# Patient Record
Sex: Male | Born: 1935 | Race: Black or African American | Hispanic: No | Smoking: Former smoker
Health system: Southern US, Community
[De-identification: ages and names within clinical notes are randomized; demographics above are authoritative.]

## PROBLEM LIST (undated history)

## (undated) DIAGNOSIS — M1990 Unspecified osteoarthritis, unspecified site: Secondary | ICD-10-CM

## (undated) DIAGNOSIS — Z8709 Personal history of other diseases of the respiratory system: Secondary | ICD-10-CM

## (undated) DIAGNOSIS — E785 Hyperlipidemia, unspecified: Secondary | ICD-10-CM

## (undated) DIAGNOSIS — I440 Atrioventricular block, first degree: Secondary | ICD-10-CM

## (undated) DIAGNOSIS — N529 Male erectile dysfunction, unspecified: Secondary | ICD-10-CM

## (undated) DIAGNOSIS — K219 Gastro-esophageal reflux disease without esophagitis: Secondary | ICD-10-CM

## (undated) DIAGNOSIS — R0600 Dyspnea, unspecified: Secondary | ICD-10-CM

## (undated) DIAGNOSIS — R0609 Other forms of dyspnea: Secondary | ICD-10-CM

## (undated) DIAGNOSIS — N471 Phimosis: Secondary | ICD-10-CM

## (undated) DIAGNOSIS — F419 Anxiety disorder, unspecified: Secondary | ICD-10-CM

## (undated) DIAGNOSIS — Z86010 Personal history of colonic polyps: Principal | ICD-10-CM

## (undated) DIAGNOSIS — K5730 Diverticulosis of large intestine without perforation or abscess without bleeding: Secondary | ICD-10-CM

## (undated) DIAGNOSIS — R351 Nocturia: Secondary | ICD-10-CM

## (undated) DIAGNOSIS — N281 Cyst of kidney, acquired: Secondary | ICD-10-CM

## (undated) DIAGNOSIS — F32A Depression, unspecified: Secondary | ICD-10-CM

## (undated) DIAGNOSIS — F329 Major depressive disorder, single episode, unspecified: Secondary | ICD-10-CM

## (undated) DIAGNOSIS — N289 Disorder of kidney and ureter, unspecified: Secondary | ICD-10-CM

## (undated) DIAGNOSIS — C61 Malignant neoplasm of prostate: Secondary | ICD-10-CM

## (undated) DIAGNOSIS — I10 Essential (primary) hypertension: Secondary | ICD-10-CM

## (undated) HISTORY — PX: COLONOSCOPY: SHX174

## (undated) HISTORY — DX: Personal history of colonic polyps: Z86.010

## (undated) HISTORY — DX: Gastro-esophageal reflux disease without esophagitis: K21.9

## (undated) HISTORY — DX: Anxiety disorder, unspecified: F41.9

## (undated) HISTORY — PX: CARDIOVASCULAR STRESS TEST: SHX262

---

## 1999-09-11 ENCOUNTER — Inpatient Hospital Stay (HOSPITAL_COMMUNITY): Admission: EM | Admit: 1999-09-11 | Discharge: 1999-09-20 | Payer: Self-pay | Admitting: *Deleted

## 1999-09-11 ENCOUNTER — Encounter: Payer: Self-pay | Admitting: Internal Medicine

## 1999-09-11 HISTORY — PX: APPENDECTOMY: SHX54

## 1999-09-12 ENCOUNTER — Encounter: Payer: Self-pay | Admitting: Surgery

## 1999-09-13 ENCOUNTER — Encounter: Payer: Self-pay | Admitting: Surgery

## 2000-12-17 ENCOUNTER — Encounter: Payer: Self-pay | Admitting: Surgery

## 2000-12-24 ENCOUNTER — Inpatient Hospital Stay (HOSPITAL_COMMUNITY): Admission: RE | Admit: 2000-12-24 | Discharge: 2000-12-31 | Payer: Self-pay | Admitting: Surgery

## 2000-12-24 HISTORY — PX: OTHER SURGICAL HISTORY: SHX169

## 2001-12-02 ENCOUNTER — Ambulatory Visit: Admission: RE | Admit: 2001-12-02 | Discharge: 2001-12-14 | Payer: Self-pay | Admitting: Radiation Oncology

## 2004-07-03 ENCOUNTER — Ambulatory Visit: Payer: Self-pay | Admitting: Endocrinology

## 2004-07-12 ENCOUNTER — Ambulatory Visit: Payer: Self-pay | Admitting: Endocrinology

## 2004-07-27 ENCOUNTER — Ambulatory Visit: Payer: Self-pay | Admitting: Endocrinology

## 2004-08-07 ENCOUNTER — Ambulatory Visit: Payer: Self-pay | Admitting: Endocrinology

## 2004-09-11 ENCOUNTER — Ambulatory Visit: Payer: Self-pay | Admitting: Endocrinology

## 2004-12-04 ENCOUNTER — Ambulatory Visit: Payer: Self-pay | Admitting: Endocrinology

## 2005-02-12 ENCOUNTER — Ambulatory Visit: Payer: Self-pay | Admitting: Endocrinology

## 2005-02-21 ENCOUNTER — Ambulatory Visit (HOSPITAL_COMMUNITY): Admission: RE | Admit: 2005-02-21 | Discharge: 2005-02-21 | Payer: Self-pay | Admitting: Endocrinology

## 2005-02-26 ENCOUNTER — Ambulatory Visit: Payer: Self-pay | Admitting: Endocrinology

## 2005-10-22 ENCOUNTER — Ambulatory Visit: Payer: Self-pay | Admitting: Endocrinology

## 2005-11-01 ENCOUNTER — Ambulatory Visit: Payer: Self-pay | Admitting: Endocrinology

## 2005-11-21 ENCOUNTER — Ambulatory Visit: Payer: Self-pay | Admitting: Endocrinology

## 2005-11-29 ENCOUNTER — Ambulatory Visit: Payer: Self-pay | Admitting: Endocrinology

## 2006-05-07 ENCOUNTER — Ambulatory Visit: Payer: Self-pay | Admitting: Endocrinology

## 2006-05-07 LAB — CONVERTED CEMR LAB
ALT: 15 units/L (ref 0–40)
AST: 17 units/L (ref 0–37)
Basophils Relative: 0.2 % (ref 0.0–1.0)
Calcium: 10.2 mg/dL (ref 8.4–10.5)
Chloride: 103 meq/L (ref 96–112)
Eosinophils Relative: 0.3 % (ref 0.0–5.0)
GFR calc Af Amer: 70 mL/min
GFR calc non Af Amer: 58 mL/min
Lymphocytes Relative: 19.3 % (ref 12.0–46.0)
Neutrophils Relative %: 72.8 % (ref 43.0–77.0)
Platelets: 236 10*3/uL (ref 150–400)
Potassium: 3.7 meq/L (ref 3.5–5.1)
RBC: 5.07 M/uL (ref 4.22–5.81)
Total Bilirubin: 0.7 mg/dL (ref 0.3–1.2)
WBC: 7.3 10*3/uL (ref 4.5–10.5)

## 2006-05-08 ENCOUNTER — Ambulatory Visit: Payer: Self-pay | Admitting: Cardiology

## 2006-09-15 ENCOUNTER — Encounter: Payer: Self-pay | Admitting: Endocrinology

## 2006-09-15 DIAGNOSIS — M1990 Unspecified osteoarthritis, unspecified site: Secondary | ICD-10-CM | POA: Insufficient documentation

## 2006-09-15 DIAGNOSIS — I10 Essential (primary) hypertension: Secondary | ICD-10-CM | POA: Insufficient documentation

## 2006-09-15 DIAGNOSIS — F0390 Unspecified dementia without behavioral disturbance: Secondary | ICD-10-CM | POA: Insufficient documentation

## 2006-11-26 ENCOUNTER — Ambulatory Visit: Payer: Self-pay | Admitting: Endocrinology

## 2006-11-26 LAB — CONVERTED CEMR LAB
AST: 23 units/L (ref 0–37)
BUN: 17 mg/dL (ref 6–23)
Bilirubin, Direct: 0.1 mg/dL (ref 0.0–0.3)
Direct LDL: 147.8 mg/dL
HDL: 49.5 mg/dL (ref >39.0)
Total Bilirubin: 0.8 mg/dL (ref 0.3–1.2)
Total CHOL/HDL Ratio: 4.1
Total Protein: 8.1 g/dL (ref 6.0–8.3)

## 2006-12-07 ENCOUNTER — Encounter: Payer: Self-pay | Admitting: Endocrinology

## 2007-06-11 ENCOUNTER — Encounter: Payer: Self-pay | Admitting: Endocrinology

## 2007-07-17 ENCOUNTER — Ambulatory Visit: Payer: Self-pay | Admitting: Endocrinology

## 2007-07-17 DIAGNOSIS — R519 Headache, unspecified: Secondary | ICD-10-CM | POA: Insufficient documentation

## 2007-07-17 DIAGNOSIS — R51 Headache: Secondary | ICD-10-CM | POA: Insufficient documentation

## 2007-07-19 LAB — CONVERTED CEMR LAB

## 2007-07-20 LAB — CONVERTED CEMR LAB
CO2: 28 meq/L (ref 19–32)
Chloride: 107 meq/L (ref 96–112)
GFR calc non Af Amer: 58 mL/min
Potassium: 4.2 meq/L (ref 3.5–5.1)
Vitamin B-12: 765 pg/mL (ref 211–911)

## 2007-08-03 ENCOUNTER — Encounter: Admission: RE | Admit: 2007-08-03 | Discharge: 2007-08-03 | Payer: Self-pay | Admitting: Endocrinology

## 2007-08-18 ENCOUNTER — Ambulatory Visit: Payer: Self-pay | Admitting: Endocrinology

## 2007-08-18 DIAGNOSIS — F329 Major depressive disorder, single episode, unspecified: Secondary | ICD-10-CM | POA: Insufficient documentation

## 2007-11-25 ENCOUNTER — Ambulatory Visit: Payer: Self-pay | Admitting: Endocrinology

## 2007-12-17 ENCOUNTER — Encounter: Payer: Self-pay | Admitting: Endocrinology

## 2007-12-28 ENCOUNTER — Telehealth: Payer: Self-pay | Admitting: Endocrinology

## 2008-03-29 ENCOUNTER — Ambulatory Visit: Payer: Self-pay | Admitting: Endocrinology

## 2008-04-13 ENCOUNTER — Ambulatory Visit: Payer: Self-pay | Admitting: Endocrinology

## 2008-04-13 DIAGNOSIS — N529 Male erectile dysfunction, unspecified: Secondary | ICD-10-CM | POA: Insufficient documentation

## 2008-04-13 DIAGNOSIS — M25519 Pain in unspecified shoulder: Secondary | ICD-10-CM | POA: Insufficient documentation

## 2008-04-13 DIAGNOSIS — M79609 Pain in unspecified limb: Secondary | ICD-10-CM | POA: Insufficient documentation

## 2008-04-13 LAB — CONVERTED CEMR LAB
ALT: 38 units/L (ref 0–53)
AST: 29 units/L (ref 0–37)
Albumin: 3.6 g/dL (ref 3.5–5.2)
Alkaline Phosphatase: 52 units/L (ref 39–117)
BUN: 22 mg/dL (ref 6–23)
Basophils Absolute: 0 10*3/uL (ref 0.0–0.1)
Basophils Relative: 1 % (ref 0.0–3.0)
Bilirubin Urine: NEGATIVE
Bilirubin, Direct: 0.1 mg/dL (ref 0.0–0.3)
CO2: 28 meq/L (ref 19–32)
Calcium: 9.6 mg/dL (ref 8.4–10.5)
Chloride: 104 meq/L (ref 96–112)
Cholesterol: 176 mg/dL (ref 0–200)
Creatinine, Ser: 1.3 mg/dL (ref 0.4–1.5)
Eosinophils Absolute: 0.2 10*3/uL (ref 0.0–0.7)
Eosinophils Relative: 3.6 % (ref 0.0–5.0)
GFR calc Af Amer: 70 mL/min
GFR calc non Af Amer: 58 mL/min
Glucose, Bld: 109 mg/dL — ABNORMAL HIGH (ref 70–99)
HCT: 44.5 % (ref 39.0–52.0)
HDL: 48.6 mg/dL (ref >39.0)
Hemoglobin, Urine: NEGATIVE
Hemoglobin: 14.7 g/dL (ref 13.0–17.0)
Ketones, ur: NEGATIVE mg/dL
LDL Cholesterol: 114 mg/dL — ABNORMAL HIGH (ref 0–99)
Leukocytes, UA: NEGATIVE
Lymphocytes Relative: 30.2 % (ref 12.0–46.0)
MCHC: 33 g/dL (ref 30.0–36.0)
MCV: 89.8 fL (ref 78.0–100.0)
Monocytes Absolute: 0.6 10*3/uL (ref 0.1–1.0)
Monocytes Relative: 13.6 % — ABNORMAL HIGH (ref 3.0–12.0)
Neutro Abs: 2.5 10*3/uL (ref 1.4–7.7)
Neutrophils Relative %: 51.6 % (ref 43.0–77.0)
Nitrite: NEGATIVE
PSA: 3.7 ng/mL (ref 0.10–4.00)
Platelets: 193 10*3/uL (ref 150–400)
Potassium: 4.5 meq/L (ref 3.5–5.1)
RBC: 4.96 M/uL (ref 4.22–5.81)
RDW: 13.1 % (ref 11.5–14.6)
Sodium: 138 meq/L (ref 135–145)
Specific Gravity, Urine: 1.015 (ref 1.000–1.03)
TSH: 1.65 microintl units/mL (ref 0.35–5.50)
Testosterone: 367.61 ng/dL (ref 350.00–890)
Total Bilirubin: 0.6 mg/dL (ref 0.3–1.2)
Total CHOL/HDL Ratio: 3.6
Total Protein, Urine: NEGATIVE mg/dL
Total Protein: 7.5 g/dL (ref 6.0–8.3)
Triglycerides: 69 mg/dL (ref 0–149)
Uric Acid, Serum: 6.4 mg/dL (ref 4.0–7.8)
Urine Glucose: NEGATIVE mg/dL
Urobilinogen, UA: 0.2 (ref 0.0–1.0)
VLDL: 14 mg/dL (ref 0–40)
WBC: 4.7 10*3/uL (ref 4.5–10.5)
pH: 5.5 (ref 5.0–8.0)

## 2008-04-22 ENCOUNTER — Telehealth (INDEPENDENT_AMBULATORY_CARE_PROVIDER_SITE_OTHER): Payer: Self-pay | Admitting: *Deleted

## 2008-05-11 ENCOUNTER — Ambulatory Visit: Payer: Self-pay | Admitting: Endocrinology

## 2008-05-11 DIAGNOSIS — E7800 Pure hypercholesterolemia, unspecified: Secondary | ICD-10-CM | POA: Insufficient documentation

## 2008-05-11 DIAGNOSIS — N400 Enlarged prostate without lower urinary tract symptoms: Secondary | ICD-10-CM | POA: Insufficient documentation

## 2008-06-16 ENCOUNTER — Encounter: Payer: Self-pay | Admitting: Endocrinology

## 2008-12-16 ENCOUNTER — Encounter: Payer: Self-pay | Admitting: Endocrinology

## 2008-12-19 ENCOUNTER — Ambulatory Visit: Payer: Self-pay | Admitting: Endocrinology

## 2009-05-08 ENCOUNTER — Ambulatory Visit: Payer: Self-pay | Admitting: Endocrinology

## 2009-05-08 DIAGNOSIS — IMO0001 Reserved for inherently not codable concepts without codable children: Secondary | ICD-10-CM | POA: Insufficient documentation

## 2009-05-08 LAB — CONVERTED CEMR LAB
BUN: 16 mg/dL (ref 6–23)
Basophils Relative: 0.6 % (ref 0.0–3.0)
Bilirubin Urine: NEGATIVE
Bilirubin, Direct: 0.1 mg/dL (ref 0.0–0.3)
CO2: 29 meq/L (ref 19–32)
Chloride: 106 meq/L (ref 96–112)
Cholesterol: 154 mg/dL (ref 0–200)
Creatinine, Ser: 1.3 mg/dL (ref 0.4–1.5)
Eosinophils Absolute: 0.1 10*3/uL (ref 0.0–0.7)
Glucose, Bld: 90 mg/dL (ref 70–99)
Hemoglobin, Urine: NEGATIVE
Leukocytes, UA: NEGATIVE
MCHC: 32.1 g/dL (ref 30.0–36.0)
MCV: 90.7 fL (ref 78.0–100.0)
Monocytes Absolute: 0.5 10*3/uL (ref 0.1–1.0)
Neutrophils Relative %: 53.9 % (ref 43.0–77.0)
Nitrite: NEGATIVE
PSA: 2.81 ng/mL (ref 0.10–4.00)
Platelets: 186 10*3/uL (ref 150.0–400.0)
RBC: 4.74 M/uL (ref 4.22–5.81)
Total Protein: 7.8 g/dL (ref 6.0–8.3)
Triglycerides: 79 mg/dL (ref 0.0–149.0)
Urobilinogen, UA: 0.2 (ref 0.0–1.0)
pH: 5 (ref 5.0–8.0)

## 2009-05-19 ENCOUNTER — Ambulatory Visit: Payer: Self-pay | Admitting: Endocrinology

## 2009-10-12 ENCOUNTER — Encounter: Payer: Self-pay | Admitting: Endocrinology

## 2009-12-26 ENCOUNTER — Ambulatory Visit: Payer: Self-pay | Admitting: Endocrinology

## 2009-12-27 LAB — CONVERTED CEMR LAB: Total CK: 186 units/L (ref 7–232)

## 2010-03-11 ENCOUNTER — Encounter: Payer: Self-pay | Admitting: Endocrinology

## 2010-03-22 NOTE — Letter (Signed)
[facility]: [facility]   Imported By: Sherian Rein 10/18/2009 13:17:47  _____________________________________________________________________  External Attachment:    Type:   Image     Comment:   External Document

## 2010-03-22 NOTE — Assessment & Plan Note (Signed)
Summary: pain in limbs-lb   Vital Signs:  Patient profile:   77 year old male Height:      71 inches (180.34 cm) Weight:      222 pounds (100.91 kg) BMI:     31.07 O2 Sat:      97 % on Room air Temp:     97.1 degrees F (36.17 degrees C) oral Pulse rate:   83 / minute BP sitting:   102 / 72  (left arm) Cuff size:   large  Vitals Entered By: Miguel Blake RMA (May 08, 2009 2:10 PM)  O2 Flow:  Room air CC: Pain in limbs (shoulders and knees) Xmonths/ pt states he is no longer taking Viagra/ CF   Primary Provider:  Everardo All  CC:  Pain in limbs (shoulders and knees) Xmonths/ pt states he is no longer taking Viagra/ CF.  History of Present Illness: pt states few mos of intermittent diffuse moderate myalgias, worst at the thighs and shoulders.  no associated numbness.   he says his bp med is expensive, and causes dizziness.   Current Medications (verified): 1)  Flomax 0.4 Mg  Cp24 (Tamsulosin Hcl) .... Take 1 By Mouth Once Daily 2)  Proscar 5 Mg  Tabs (Finasteride) .... Take 1 By Mouth Qd 3)  Zocor 80 Mg  Tabs (Simvastatin) .... Take 1 By Mouth Once Daily 4)  Diovan Hct 160-25 Mg  Tabs (Valsartan-Hydrochlorothiazide) .... Take 1 By Mouth Qd 5)  Viagra 50 Mg  Tabs (Sildenafil Citrate) .... Use Prn 6)  Celexa 20 Mg  Tabs (Citalopram Hydrobromide) .... Qd  Allergies (verified): 1)  ! Lescol 2)  ! Vasotec 3)  ! Penicillin  Past History:  Past Medical History: Last updated: 09/15/2006 Dementia Hypertension Osteoarthritis BPH Smoker (Quit 1980) Increased LDL Increased PSA/Prostate Cancer Non-Cardiogenic Decreased WBC  Review of Systems       The patient complains of weight gain.  The patient denies dyspnea on exertion and syncope.         dehies skin rash.  Physical Exam  General:  normal appearance.   Msk:  no muscle tenderness. gait is normal and steady Additional Exam:  LDL Cholesterol           84 mg/dL     Impression & Recommendations:  Problem # 1:   MYALGIA (ICD-729.1) uncertain etiology  Problem # 2:  HYPERCHOLESTEROLEMIA (ICD-272.0) well-controlled, but ? tolerates zocor  Problem # 3:  HYPERTENSION (ICD-401.9) overcontrolled  Medications Added to Medication List This Visit: 1)  Losartan Potassium-hctz 50-12.5 Mg Tabs (Losartan potassium-hctz) .Marland Kitchen.. 1 once daily  Other Orders: TLB-Lipid Panel (80061-LIPID) TLB-BMP (Basic Metabolic Panel-BMET) (80048-METABOL) TLB-CBC Platelet - w/Differential (85025-CBCD) TLB-Hepatic/Liver Function Pnl (80076-HEPATIC) TLB-TSH (Thyroid Stimulating Hormone) (84443-TSH) TLB-PSA (Prostate Specific Antigen) (84153-PSA) TLB-Udip w/ Micro (81001-URINE) TLB-Sedimentation Rate (ESR) (85652-ESR) TLB-CK Total Only(Creatine Kinase/CPK) (82550-CK) Est. Patient Level IV (11914)  Patient Instructions: 1)  blood tests today. 2)  tests are being ordered for you today.  a few days after the test(s), please call [phone] to hear your test results. 3)  physical soon.  4)  try stopping the zocor (simvastatin), on a trial basis, to see if this helps. 5)  change diovan-hct to losartan-hct, 50/12.5, 1 per day Prescriptions: LOSARTAN POTASSIUM-HCTZ 50-12.5 MG TABS (LOSARTAN POTASSIUM-HCTZ) 1 once daily  #30 x 11   Entered and Authorized by:   Minus Breeding MD   Signed by:   Minus Breeding MD on 05/08/2009   Method used:  Electronically to        [facility] [phone]* (retail)       [address]       [place], [place]  [address]       Ph: [phone]       Fax: [phone]   RxID:   [id number]
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
## 2010-04-16 ENCOUNTER — Encounter: Payer: Self-pay | Admitting: Endocrinology

## 2010-04-23 ENCOUNTER — Ambulatory Visit (INDEPENDENT_AMBULATORY_CARE_PROVIDER_SITE_OTHER): Payer: Medicare Other | Admitting: Endocrinology

## 2010-04-23 ENCOUNTER — Other Ambulatory Visit: Payer: Self-pay | Admitting: Endocrinology

## 2010-04-23 ENCOUNTER — Encounter: Payer: Self-pay | Admitting: Endocrinology

## 2010-04-23 ENCOUNTER — Other Ambulatory Visit: Payer: Medicare Other

## 2010-04-23 ENCOUNTER — Ambulatory Visit (INDEPENDENT_AMBULATORY_CARE_PROVIDER_SITE_OTHER)
Admission: RE | Admit: 2010-04-23 | Discharge: 2010-04-23 | Disposition: A | Discharge status: Home / Self Care | Payer: Medicare Other | Source: Ambulatory Visit | Attending: Endocrinology | Admitting: Endocrinology

## 2010-04-23 DIAGNOSIS — M25569 Pain in unspecified knee: Secondary | ICD-10-CM

## 2010-04-23 DIAGNOSIS — I10 Essential (primary) hypertension: Secondary | ICD-10-CM

## 2010-04-23 DIAGNOSIS — E7800 Pure hypercholesterolemia, unspecified: Secondary | ICD-10-CM

## 2010-04-23 DIAGNOSIS — D509 Iron deficiency anemia, unspecified: Secondary | ICD-10-CM

## 2010-04-23 DIAGNOSIS — E785 Hyperlipidemia, unspecified: Secondary | ICD-10-CM

## 2010-04-23 DIAGNOSIS — N401 Enlarged prostate with lower urinary tract symptoms: Secondary | ICD-10-CM

## 2010-04-23 DIAGNOSIS — D500 Iron deficiency anemia secondary to blood loss (chronic): Secondary | ICD-10-CM | POA: Insufficient documentation

## 2010-04-23 DIAGNOSIS — Z79899 Other long term (current) drug therapy: Secondary | ICD-10-CM

## 2010-04-23 DIAGNOSIS — F068 Other specified mental disorders due to known physiological condition: Secondary | ICD-10-CM

## 2010-04-23 DIAGNOSIS — N138 Other obstructive and reflux uropathy: Secondary | ICD-10-CM

## 2010-04-23 LAB — CBC WITH DIFFERENTIAL/PLATELET
Basophils Absolute: 0 10*3/uL (ref 0.0–0.1)
Eosinophils Relative: 3 % (ref 0.0–5.0)
Monocytes Absolute: 0.6 10*3/uL (ref 0.1–1.0)
Monocytes Relative: 12.9 % — ABNORMAL HIGH (ref 3.0–12.0)
Neutrophils Relative %: 48.4 % (ref 43.0–77.0)
Platelets: 204 10*3/uL (ref 150.0–400.0)
RDW: 14.4 % (ref 11.5–14.6)
WBC: 4.8 10*3/uL (ref 4.5–10.5)

## 2010-04-23 LAB — LIPID PANEL
Cholesterol: 256 mg/dL — ABNORMAL HIGH (ref 0–200)
HDL: 47 mg/dL (ref >39.00)
Triglycerides: 75 mg/dL (ref 0.0–149.0)
VLDL: 15 mg/dL (ref 0.0–40.0)

## 2010-04-23 LAB — LDL CHOLESTEROL, DIRECT: Direct LDL: 194 mg/dL

## 2010-04-23 LAB — BASIC METABOLIC PANEL
BUN: 16 mg/dL (ref 6–23)
Creatinine, Ser: 1.2 mg/dL (ref 0.4–1.5)
GFR: 79.6 mL/min (ref >60.00)

## 2010-04-23 LAB — HEPATIC FUNCTION PANEL
ALT: 19 U/L (ref 0–53)
AST: 20 U/L (ref 0–37)
Bilirubin, Direct: 0.1 mg/dL (ref 0.0–0.3)
Total Bilirubin: 0.8 mg/dL (ref 0.3–1.2)

## 2010-04-23 LAB — IBC PANEL: Iron: 74 ug/dL (ref 42–165)

## 2010-04-23 LAB — SEDIMENTATION RATE: Sed Rate: 19 mm/hr (ref 0–22)

## 2010-04-23 LAB — PSA: PSA: 2.99 ng/mL (ref 0.10–4.00)

## 2010-04-26 ENCOUNTER — Telehealth: Payer: Self-pay | Admitting: Endocrinology

## 2010-04-26 NOTE — Letter (Signed)
Summary: [facility]   Imported By: Lennie Odor 04/20/2010 12:32:27  _____________________________________________________________________  External Attachment:    Type:   Image     Comment:   External Document

## 2010-05-01 NOTE — Assessment & Plan Note (Signed)
Summary: BP IS ELEV /NWS   Vital Signs:  Patient profile:   77 year old male Height:      71 inches (180.34 cm) Weight:      224.50 pounds (102.05 kg) BMI:     31.42 O2 Sat:      96 % on Room air Temp:     99.0 degrees F (37.22 degrees C) oral Pulse rate:   69 / minute Pulse rhythm:   regular BP sitting:   138 / 82  (left arm) Cuff size:   large  Vitals Entered By: Brenton Grills CMA (AAMA) (April 23, 2010 1:32 PM)  O2 Flow:  Room air CC: Elevated BP over weekend/aj Is Patient Diabetic? No   Primary Provider:  Gurpreet Mariani  CC:  Elevated BP over weekend/aj.  History of Present Illness: pt cheched his bp at the drugstore, where it was 187/110.  pt states he feels well in general, except for a slight headache.   pt states 1 year of moderate pain at the knees, left > right.   dyslipdemia therapy is limited by perceived drug intolerances   Current Medications (verified): 1)  Flomax 0.4 Mg  Cp24 (Tamsulosin Hcl) .... Take 1 By Mouth Once Daily 2)  Proscar 5 Mg  Tabs (Finasteride) .... Take 1 By Mouth Qd 3)  Celexa 20 Mg  Tabs (Citalopram Hydrobromide) .... Qd 4)  Losartan Potassium-Hctz 50-12.5 Mg Tabs (Losartan Potassium-Hctz) .Marland Kitchen.. 1 Once Daily  Allergies (verified): 1)  ! Lescol 2)  ! Vasotec 3)  ! Penicillin  Past History:  Past Medical History: Last updated: 09/15/2006 Dementia Hypertension Osteoarthritis BPH Smoker (Quit 1980) Increased LDL Increased PSA/Prostate Cancer Non-Cardiogenic Decreased WBC  Review of Systems  The patient denies fever, chest pain, syncope, and dyspnea on exertion.    Physical Exam  General:  obese.  no distress  Msk:  knees are normal to my exam gait is normal and steady.   Extremities:  1+ right pedal edema and 1+ left pedal edema.     Impression & Recommendations:  Problem # 1:  HYPERTENSION (ICD-401.9) needs increased rx  Problem # 2:  KNEE PAIN, BILATERAL (ICD-719.46) Assessment: New  Problem # 3:   HYPERCHOLESTEROLEMIA (ICD-272.0) needs increased rx  Medications Added to Medication List This Visit: 1)  Losartan Potassium-hctz 50-12.5 Mg Tabs (Losartan potassium-hctz) .Marland Kitchen.. 1 1/2 tabs, once daily  Other Orders: T-Knee Left 2 view (73560TC) TLB-Lipid Panel (80061-LIPID) TLB-BMP (Basic Metabolic Panel-BMET) (80048-METABOL) TLB-CBC Platelet - w/Differential (85025-CBCD) TLB-Hepatic/Liver Function Pnl (80076-HEPATIC) TLB-TSH (Thyroid Stimulating Hormone) (84443-TSH) TLB-PSA (Prostate Specific Antigen) (84153-PSA) TLB-Sedimentation Rate (ESR) (85652-ESR) TLB-Uric Acid, Blood (84550-URIC) TLB-B12, Serum-Total ONLY (82607-B12) TLB-IBC Pnl (Iron/FE;Transferrin) (83550-IBC) Est. Patient Level IV ([address])  Patient Instructions: 1)  blood tests and an x-ray are being ordered for you today.  please call [phone] to hear your test results. 2)  increase losartan-hctz, to 1 1/2 pills once daily.   3)  try a pain cream to the knees, to see if it helps the pain.  call if you want a prescription pain medication instead. 4)  Please schedule a "wellness" appointment in 6 weeks. 5)  (update: i left message on phone-tree:  i advised zetia and/or colestipol.  i advised pt to see ortho.  call if you want referral).   Orders Added: 1)  T-Knee Left 2 view [73560TC] 2)  TLB-Lipid Panel [80061-LIPID] 3)  TLB-BMP (Basic Metabolic Panel-BMET) [80048-METABOL] 4)  TLB-CBC Platelet - w/Differential [85025-CBCD] 5)  TLB-Hepatic/Liver Function Pnl [80076-HEPATIC] 6)  TLB-TSH (Thyroid Stimulating Hormone) [84443-TSH] 7)  TLB-PSA (Prostate Specific Antigen) [84153-PSA] 8)  TLB-Sedimentation Rate (ESR) [85652-ESR] 9)  TLB-Uric Acid, Blood [84550-URIC] 10)  TLB-B12, Serum-Total ONLY [82607-B12] 11)  TLB-IBC Pnl (Iron/FE;Transferrin) [83550-IBC] 12)  Est. Patient Level IV [19147]

## 2010-05-01 NOTE — Progress Notes (Signed)
Summary: REFIL REQUEST  Phone Note Call from Patient Call back at Home Phone [phone]   Caller: Patient Summary of Call: PT IS REQUESTING CHOLESTEROL MEDICINE .  HE USES [facility] IN [place] [facility] [address]. Initial call taken by: Hilarie Fredrickson,  April 26, 2010 9:56 AM  Follow-up for Phone Call        Pt called requesting Rx for cholesterol mgmt and pain as discussed at OV.  Follow-up by: Margaret Pyle, CMA,  April 26, 2010 10:13 AM  Additional Follow-up for Phone Call Additional follow up Details #1::        i sent rx Additional Follow-up by: Minus Breeding MD,  April 26, 2010 10:53 AM    Additional Follow-up for Phone Call Additional follow up Details #2::    Pt is also requesting pain medication as discussed at OV Follow-up by: Margaret Pyle, CMA,  April 26, 2010 10:57 AM  Additional Follow-up for Phone Call Additional follow up Details #3:: Details for Additional Follow-up Action Taken: i printed Additional Follow-up by: Minus Breeding MD,  April 26, 2010 11:28 AM  New/Updated Medications: ZETIA 10 MG TABS (EZETIMIBE) 1 tab once daily HYDROCODONE-ACETAMINOPHEN 5-325 MG TABS (HYDROCODONE-ACETAMINOPHEN) 1/2 or 1 pill every 4 hrs as needed for pain Prescriptions: HYDROCODONE-ACETAMINOPHEN 5-325 MG TABS (HYDROCODONE-ACETAMINOPHEN) 1/2 or 1 pill every 4 hrs as needed for pain  #50 x 1   Entered and Authorized by:   Minus Breeding MD   Signed by:   Minus Breeding MD on 04/26/2010   Method used:   Print then Give to Patient   RxID:   [id number] ZETIA 10 MG TABS (EZETIMIBE) 1 tab once daily  #30 x 11   Entered and Authorized by:   Minus Breeding MD   Signed by:   Minus Breeding MD on 04/26/2010   Method used:   Electronically to        [facility] [phone]* (retail)       [address]       [place], [place]  [address]       Ph: [phone]       Fax: [phone]   RxID:   [id number]  Pt informed, Rx in cabinet for pt pick  up Margaret Pyle, CMA  April 26, 2010 12:57 PM

## 2010-05-23 ENCOUNTER — Other Ambulatory Visit: Payer: Self-pay | Admitting: Endocrinology

## 2010-06-04 ENCOUNTER — Encounter: Payer: Self-pay | Admitting: Endocrinology

## 2010-06-04 ENCOUNTER — Ambulatory Visit (INDEPENDENT_AMBULATORY_CARE_PROVIDER_SITE_OTHER): Payer: Medicare Other | Admitting: Endocrinology

## 2010-06-04 VITALS — BP 138/82 | HR 65 | Temp 98.2°F | Ht 71.0 in | Wt 225.2 lb

## 2010-06-04 DIAGNOSIS — R062 Wheezing: Secondary | ICD-10-CM | POA: Insufficient documentation

## 2010-06-04 DIAGNOSIS — Z0000 Encounter for general adult medical examination without abnormal findings: Secondary | ICD-10-CM

## 2010-06-04 DIAGNOSIS — I10 Essential (primary) hypertension: Secondary | ICD-10-CM

## 2010-06-04 DIAGNOSIS — I440 Atrioventricular block, first degree: Secondary | ICD-10-CM | POA: Insufficient documentation

## 2010-06-04 NOTE — Patient Instructions (Addendum)
please consider these measures for your health:  minimize alcohol.  do not use tobacco products.  keep firearms safely stored.  always use seat belts.  have working smoke alarms in your home.  see an eye doctor and dentist regularly.  never drive under the influence of alcohol or drugs (including prescription drugs).   please let me know what your wishes would be, if artificial life support measures should become necessary.  it is critically important to prevent falling down (keep floor areas well-lit, dry, and free of loose objects). i have requested a colonoscopy for you.  you will be called with a day and time for an appointment. Refer to an allergy specialist.  you will be called with a day and time for an appointment. Please return in 1 year.
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
## 2010-06-06 ENCOUNTER — Institutional Professional Consult (permissible substitution): Payer: Medicare Other | Admitting: Internal Medicine

## 2010-06-19 ENCOUNTER — Other Ambulatory Visit: Payer: Self-pay | Admitting: Endocrinology

## 2010-07-06 NOTE — Op Note (Signed)
[place]  Patient:    Miguel Blake, Miguel Blake                     MRN: [id number] Proc. Date: 09/11/99 Attending:  Velora Heckler, M.D. CC:         Titus Dubin. Alwyn Ren, M.D. [facility]                           Operative Report  PREOPERATIVE DIAGNOSIS:  Acute abdomen.  POSTOPERATIVE DIAGNOSIS:  Perforated appendicitis with peritonitis.  OPERATION PERFORMED:  Exploratory laparotomy with appendectomy.  SURGEON:  Velora Heckler, M.D.  ASSISTANT:  Lorne Skeens. Hoxworth, M.D.  ANESTHESIA:  General per Dr. Michelle Piper.  ESTIMATED BLOOD LOSS:  Minimal.  PREPARATION:  Betadine.  COMPLICATIONS:  None.  INDICATIONS FOR PROCEDURE:  The patient is a 77 year old white male who presesnts to the emergency department with a four-day history of abdominal pain, nausea and vomiting.  Abdominal x-ray showed findings consistent with partial small bowel obstruction.  CT scan of the abdomen done without oral contrast demonstrated large inflammatory process in the right colon with associated fluid and a small amount of free air.  Differential diagnosis included perforated appendicitis, perforated diverticular disease, or perforated colonic carcinoma.  The patient was brought to the operating room for exploration.  DESCRIPTION OF PROCEDURE:  The procedure was done in OR #15 at the [facility].  The patient was brought to the operating room and placed in supine position on the operating room table.  Following administration of general anesthesia, the patient was prepped and draped in the usual strict aseptic fashion.  After ascertaining that an adequate level of anesthesia had been obtained, a midline abdominal incision was made with a #10 blade.  Dissection was carried down through subcutaneous tissues and hemostasis obtained with the electrocautery.  Fascia was incised in the midline including incised through the incarcerated umbilical hernia and peritoneum  is reduced back through the peritoneal cavity.  The fascia was fully opened. The peritoneal cavity was entered cautiously.  There is gross purulent fluid throughout the peritoneal cavity.  Representative cultures were sent to the laboratory.  Using Pool sucker, approximately 600 cc of purulent fluid was evacuated.  The right side of the abdomen was addressed.  On exploration there was an obvious inflamed necrotic appendix with gross perforation near the tip with an appendiculith measuring less than 1 cm present in the right colic gutter.  This was extracted.  The appendix was mobilized.  Terminal ileum was mobilized from peritoneal attachments.  The appendiceal mesentery was divided between [facility] clamps and ligated with 3-0 silk ties.  Dissection was carried up to the base of the appendix which was grossly necrotic.  Using Allis clamps, a portion of the cecal wall was elevated and stapled with a TIA-30 stapler.  The appendix was then excised. Hemostasis was obtained with 3-0 silk sutures.  The abdomen was copiously irrigated with warm saline which was evacuated.  All four quadrants of the abdomen as well as the pelvis were copiously irrigated with warm fluid which was evacuated.  Omentum was used to cover the small bowel.  The midline wound was closed with running #1 Novofil suture.  The subcutaneous tissues were irrigated.  Hemostasis was obtained with the electrocautery.  Umbilicus was reapproximated with stainless steel staples.  Subcutaneous tissues were packed with Betadine soaked 4 x 4 gauze sponges.  Sterile dressings were  applied. The patient was awakened from anesthesia and brought to the recovery room in stable condition.  The patient tolerated the procedure well. DD:  09/11/99 TD:  09/13/99 Job: [id number]

## 2010-07-06 NOTE — Consult Note (Signed)
[place]. [facility]  Patient:    Miguel Blake, Miguel Blake                        MRN: [id number] Proc. Date: 09/11/99 Attending:  Velora Heckler, M.D. Dictator:   [phone] CC:         Titus Dubin. Alwyn Ren, M.D. [facility]             Velora Heckler, M.D.                          Consultation Report  (TIME DICTATED: 2150)  REASON FOR CONSULTATION: The patient is a 77 year old black male who presents with a four day history of abdominal pain and nausea and vomiting.  The patient had developed abdominal pain following eating a watermelon on September 08, 1999 and this was followed by nausea and vomiting.  The pain was located centrally and to the right side of the abdomen.  The patient denies fevers but did note chills.  He notes a decrease in bowel movements, with no bowel movement or flatus today.  He presented to the office of Dr. Romero Belling.  He was sent to the emergency department of [facility], where abdominal x-rays demonstrated partial small bowel obstruction.  The patient had an episode of emesis in the emergency department.  Abdominal x-ray showed dilated small bowel with air fluid levels.  CT scan of the abdomen and pelvis was obtained and showed fluid along the right colon with loculated free air outside the colonic lumen.  An inflammatory process was present in the mid right colon, with what appears to be a high-grade obstruction.  Differential diagnosis includes perforated appendicitis versus perforated diverticular disease versus perforated colon carcinoma.  This was reviewed personally with Dr. Irish Lack in the radiology department reading room.  PAST MEDICAL HISTORY:  1. History of benign prostatic hypertrophy.  2. History of osteoarthritis.  3. History of hypertension.  4. History of hypercholesterolemia.  MEDICATIONS:  1. Daypro 600 mg 2 p.o. p.r.n.  2. Lipitor 20 mg q.d.  3. Vasotec 10 mg p.o. b.i.d.  4. Flomax 0.4 mg q.d.  ALLERGIES:  PENICILLIN.  SOCIAL HISTORY: The patient is married and has four children.  He is employed at [employer].  He has a remote tobacco history, having quit in 1980.  He does not drink alcohol.  FAMILY HISTORY: No family history of colon cancer.  REVIEW OF SYSTEMS: As noted above.  Other significant positive include loss of appetite.  PHYSICAL EXAMINATION:  GENERAL: This patient is a 77 year old ill-appearing black male on a bed on ward 5700.  VITAL SIGNS: Temperature 98.4 degrees, pulse 119, respirations 20-30, blood pressure 133/86,  HEENT: Head normocephalic, atraumatic.  Sclerae nonicteric.  Mucous membranes are quite dry.  NECK: Supple without masses.  CHEST: Clear to auscultation bilaterally.  The patient is tachypneic with increased labored breathing and frequent hiccups.  A nasogastric tube is in the right naris.  CARDIAC: Tachycardia with regular rhythm.  ABDOMEN: Markedly distended.  No bowel sounds present.  There is bilious output in the nasogastric tube.  There is diffuse tenderness, with diffuse rebound and diffuse guarding.  The patient is maximally tender in the right lower quadrant.  There is an incarcerated umbilical hernia present which is only minimally tender.  EXTREMITIES: Nontender without edema.  NEUROLOGIC: No focal neurologic deficits.  He appears to be alert and oriented to person, place,  and time.  His wife is at the bedside.  LABORATORY DATA: WBC 11.7 with 87% segs, hemoglobin 15.1.  Creatinine 1.6.  RADIOGRAPHIC STUDIES: As noted in the History of Present Illness.  IMPRESSION: Acute abdomen, differential diagnosis including perforated appendicitis with abscess versus perforated diverticular disease versus perforated colonic carcinoma.  RECOMMENDATIONS:  1. Administration of intravenous antibiotics.  2. To operating room this evening for exploratory laparotomy, possible bowel     resection, possible colostomy, and drainage of possible  abscess.  I discussed this at the bedside at length with the patient and his wife and other family members.  I explained general anesthesia.  I explained exploratory laparotomy.  I explained the need for possible ostomy if there is perforation and significant contamination and infection.  I explained that this would be a temporary ostomy and could be reversed at a later date approximately three months in the future provided all went well.  I also discussed transfer to the intensive care unit postoperatively.  I will contact Dr. Alwyn Ren or whoever is on-call for his group prior to surgery.  I have notified the operating room that I would like to proceed later this evening. The patient understands and is in agreement. DD:  09/11/99 TD:  09/12/99 Job: [id number]

## 2010-07-06 NOTE — H&P (Signed)
[facility]  Patient:    Miguel Blake, Miguel Blake Visit Number: [id number] MRN: [id number]          Service Type: SUR Location: 3W 0353 01 Attending Physician:  Bonnetta Barry Dictated by:   Velora Heckler, M.D. Admit Date:  12/24/2000   CC:         Justine Null, M.D. [facility]   History and Physical  REASON FOR ADMISSION:  Ventral incisional hernia.  BRIEF HISTORY:  The patient is a 77 year old black male who underwent exploratory laparotomy in July 2001 at [facility] for perforated appendicitis with peritonitis.  Postoperative course was complicated by wound infection.  The patient eventually developed a midline ventral incisional hernia.  The patient is admitted at this time for repair of ventral incisional hernia.  PAST MEDICAL HISTORY: 1. History of exploratory laparotomy and appendectomy, July 2001, at [facility]. 2. History of hypertension. 3. History of benign prostatic hypertrophy.  MEDICATIONS: 1. Hydrocodone. 2. Enalapril 10 mg b.i.d. 3. Flomax.  ALLERGIES:  PENICILLIN.  SOCIAL HISTORY:  The patient is accompanied by his wife.  He does not smoke. He does not drink alcohol.  FAMILY HISTORY:  Notable for prostate cancer in the patients father.  REVIEW OF SYSTEMS:  Fifteen system review documented by medical record notable for hypertension, high cholesterol, prostate problems, back pain secondary to arthritis.  PHYSICAL EXAMINATION:  GENERAL:  A 77 year old well-developed, well-nourished black male, no acute distress.  VITAL SIGNS:  Temperature 96.1, pulse 60, respirations 18, blood pressure 130/90, O2 saturation 98% on room air, weight 213 pounds.  HEENT:  Normocephalic, atraumatic.  Sclerae are clear.  Dentition is fair.  NECK:  Supple without masses.  CHEST:  Clear to auscultation.  CARDIAC:  Shows regular rate and rhythm without murmur.  Peripheral pulses are full.  ABDOMEN:  Soft without  distention.  There is a well-healed scar in the lower midline.  With Valsalva there is an obvious hernia defect just to the right of the umbilicus.  There are no palpable masses.  There is no significant tenderness.  GENITALIA:  Penis and testicles are normal without lesion.  EXTREMITIES:  Nontender without edema.  NEUROLOGIC:  The patient is alert and oriented to person, place, and time without gross neurologic deficit.  LABORATORY STUDIES:  Complete blood count showing white count of 4.9, hemoglobin 13.7, hematocrit 40.5%, platelet count 217,000.  Prothrombin time was 13.5 with an INR of 1.0.  Chemistry profile is completely within normal limits.  Urinalysis is benign.  RADIOGRAPHIC STUDIES:  Chest x-ray dated December 17, 2000, shows tortuous aorta.  No active cardiopulmonary disease.  EKG:  Normal sinus rhythm with first degree AV block, otherwise, no acute abnormality.  IMPRESSION:  Ventral incisional hernia.  PLAN: 1. Admission to [facility]. 2. To operating room for laparoscopic ventral incisional hernia repair with    Gore-Tex dual mesh. 3. Routine postoperative care. Dictated by:   Velora Heckler, M.D. Attending Physician:  Bonnetta Barry DD:  12/24/00 TD:  12/24/00 Job: [id number]

## 2010-07-06 NOTE — Discharge Summary (Signed)
[place]. [facility]  Patient:    Miguel Blake, Miguel Blake                     MRN: [id number] Adm. Date:  09/11/99 Disc. Date: 09/20/99 Attending:  Velora Heckler, M.D. CC:         Titus Dubin. Alwyn Ren, M.D. [facility]   Discharge Summary  REASON FOR ADMISSION:  Abdominal pain, nausea, and vomiting.  HISTORY OF PRESENT ILLNESS:  The patient is a 77 year old black male who presented with a four-day history of abdominal pain, nausea, and vomiting. The patient did have chills.  He had had no bowel movement for the 24 hours prior to admission.  He was seen in the emergency department where an abdominal x-ray showed dilated small bowel with air/fluid levels.  A CT scan of the abdomen showed loculated free air and fluid around the right colon with inflammatory process in the right colon with high-grade obstruction.  The patient was admitted to the general medical service and general surgery was consulted.  HOSPITAL COURSE:  The patient was seen in consultation by general surgery.  A diagnosis of acute abdomen secondary to possible perforated appendicitis versus perforated diverticular disease versus perforated colon cancer was made.  The patient was prepared and taken to the operating room on the evening of September 11, 1999, where he underwent exploratory laparotomy and appendectomy for perforated appendicitis with peritonitis.  The patient was admitted to the step-down unit postoperatively.  He was treated with intravenous Primaxin. The patient had an open wound with dressing changes.  He remained relatively stable both from a cardiac and a pulmonary standpoint.  The patient defervesced on IV Primaxin.  He made gradual progress.  The ileus slowly resolved.  The nasogastric tube was removed on the fifth postoperative day and he was started on a clear liquid diet.  He was advanced to a full liquid diet and finally to a regular diet by the seventh postoperative day.  He  completed a full seven-day course of intravenous Primaxin and was observed for 24 hours after discontinuation of his antibiotics.  He was prepared for discharge home on the ninth postoperative day.  DISPOSITION:  The patient was discharged home today, September 20, 1999, in good condition, tolerating a regular diet, and ambulating independently.  FOLLOW-UP:  He will be seen by [facility] nurses twice daily for wound care.  He will be seen back at my office at [facility] in 10 days.  DISCHARGE MEDICATIONS:  Vicodin as needed for pain and other medications as per usual.  FINAL DIAGNOSIS:  Acute appendicitis with perforation and peritonitis.  CONDITION ON DISCHARGE:  Improved. DD:  09/20/99 TD:  09/21/99 Job: [id number]

## 2010-07-06 NOTE — Op Note (Signed)
[facility]  Patient:    Miguel Blake, Miguel Blake Visit Number: [id number] MRN: [id number]          Service Type: SUR Location: 3W 0353 01 Attending Physician:  Bonnetta Barry Dictated by:   Velora Heckler, M.D. Proc. Date: 12/24/00 Admit Date:  12/24/2000   CC:         Justine Null, M.D. [facility]   Operative Report  PREOPERATIVE DIAGNOSIS:  Ventral incisional hernia.  POSTOPERATIVE DIAGNOSIS:  Ventral incisional hernia.  OPERATION:  Laparoscopic repair of ventral incisional hernia with Gore-Tex dual mesh.  SURGEON:  Velora Heckler, M.D.  ASSISTANT:  Thornton Park. Daphine Deutscher, M.D.  ANESTHESIA:  General.  ESTIMATED BLOOD LOSS:  Minimal.  PREPARATION:  Betadine.  COMPLICATIONS:  None.  INDICATIONS: The patient is a 77 year old black male who had undergone exploratory laparotomy through a midline abdominal incision in July 2001 for perforated appendicitis.  Subsequently, the patient developed ventral incisional hernia.  He now comes to surgery for repair.  DESCRIPTION OF PROCEDURE:  The procedure was done in operating room #1 at the [facility]  The patient was brought to the operating room and placed in the supine position on the operating room table.  Following the administration of general anesthesia, the patient was prepped and draped in the usual strict aseptic fashion.  After ascertaining that an adequate level of anesthesia had been obtained, a transverse incision was made in the left lateral abdominal wall.  Dissection was carried through the subcutaneous tissues.  Muscles were split in line with their fibers.  The peritoneum was incised, and the peritoneal cavity was entered cautiously.  A 0 Vicryl pursestring suture was placed in the peritoneum.  An Hasson cannula was introduced and secured with a pursestring suture.  The abdomen was insufflated with carbon dioxide.  The laparoscope was introduced and the abdomen  explored. There were numerous adhesions of omentum and small bowel to the midline abdominal wound.  The hernia defect is identified.  The 5 mm operating port was placed in the left subcostal region.  Using a harmonic scalpel, lesions are lysed and mobilized off the anterior abdominal wall.  Care was taken to avoid injury to the small bowel.  The entire anterior abdominal wall was cleared.  An operating port was then placed in the right mid abdominal wall. The remaining adhesions were lysed freeing the entire anterior abdominal wall for clear visualization.  Good hemostasis was noted.  Next, spinal needles were placed through the abdominal wall to mark the margins of the fascial defect.  There was a large approximately 4 cm fascial defect immediately to the right of the umbilicus.  There is obvious disruption of the midline closure along the extent of the wound with small tiny less than 1 cm hernia defects visible.  After marking the margins of the hernia defect with spinal needles, a 5 cm additional margin is marked on the abdominal wall with a marking pin.  The measurements of the patch are 22 x 28 cm in size.  A sheet of dual mesh was brought on the field and marked and cut accordingly. Orientation marks were placed at the four points at the compass.  The #1 Novofil sutures are then placed through the mesh at the superior, inferior and left and right lateral aspects of the dual mesh.  The mesh is marked accordingly with the marking pin.  The mesh was then rolled.  The camera port is withdrawn, and the mesh  is inserted into the peritoneal cavity and the port replaced.  The mesh was then unrolled in the peritoneal cavity maintaining its proper orientation.  Using the suture retriever through stab wounds in the abdominal wall, the stay sutures were brought through the abdominal wall again superiorly, inferiorly and bilaterally.  These were tied securely allowing the dual mesh to be opposed  to the anterior abdominal wall.  Using the tacking device, two concentric rows of tacks were placed around the margins of the dual mesh securing it firmly to the anterior abdominal wall.  A few scattered tacks were placed in the midportion of the mesh to provide close approximation to the anterior abdominal wall.  Good hemostasis was noted.  Ports were removed under direct vision and pneumoperitoneum released.  All port sites were anesthetized with local anesthetic.  All surgical wounds were closed with interrupted 4-0 Vicryl subcuticular sutures.  The port used for the camera was closed in two layers with a 0 Vicryl suture in the peritoneum and a 0 Vicryl pursestring suture in the external oblique fascia.  Skin edges were then reapproximated with interrupted 4-0 Vicryl subcuticular suture.  All wounds were anesthetized with local anesthetic.  Benzoin and Steri-Strips were applied to all wounds.  Sterile dressings were applied.  The patient was awakened from anesthesia and brought to the recovery room in stable condition. The patient tolerated the procedure well. Dictated by:   Velora Heckler, M.D. Attending Physician:  Bonnetta Barry DD:  12/24/00 TD:  12/25/00 Job: [id number]

## 2010-07-06 NOTE — Discharge Summary (Signed)
[facility]  Patient:    Miguel Blake, Miguel Blake Visit Number: [id number] MRN: [id number]          Service Type: SUR Location: 3W 0353 01 Attending Physician:  Bonnetta Barry Dictated by:   Velora Heckler, M.D. Admit Date:  12/24/2000 Disc. Date: 12/31/00   CC:         Justine Null, M.D. [facility]   Discharge Summary  REASON FOR ADMISSION:  Incisional hernia.  HISTORY OF PRESENT ILLNESS:  The patient is a 77 year old, African-American male who had undergone exploratory laparotomy through a midline abdominal incision in July 2001, for perforated appendicitis.  He subsequently developed ventral incisional hernia.  The patient is now admitted at this time for elective repair.  HOSPITAL COURSE:  The patient was admitted on the morning of December 24, 2000. He was taken directly to the operating room where he underwent laparoscopic ventral incisional hernia repair with Gore-Tex dual mesh.  Postoperative course was complicated by ileus.  This required placement of a nasogastric tube.  The patient had gradual resolution of his ileus and was started on a clear liquid diet.  He had return of bowel function and was advanced to a full liquid diet and finally to a regular diet.  The patient was treated with intravenous antibiotics postoperatively.  At the time of discharge, his white blood cell count was normal at 9.3 and hemoglobin normal at 12.6.  DISPOSITION:  Discharged to home on December 31, 2000.  CONDITION ON DISCHARGE:  Good condition, tolerating a regular diet and ambulating independently.  The patient has had return of normal GI function.  DISCHARGE MEDICATIONS: 1. Vicodin as needed for pain. 2. Home medications for hypertension as per usual.  FOLLOWUP:  The patient will be seen back in Dr. Sid Falcon office at [facility] in two weeks.  DISCHARGE DIAGNOSIS:  Ventral incisional hernia. Dictated by:   Velora Heckler, M.D. Attending  Physician:  Bonnetta Barry DD:  12/31/00 TD:  12/31/00 Job: [id number]

## 2010-07-10 ENCOUNTER — Encounter: Payer: Self-pay | Admitting: Internal Medicine

## 2010-07-17 ENCOUNTER — Other Ambulatory Visit: Payer: Medicare Other

## 2010-07-17 ENCOUNTER — Ambulatory Visit (INDEPENDENT_AMBULATORY_CARE_PROVIDER_SITE_OTHER): Payer: Medicare Other | Admitting: Internal Medicine

## 2010-07-17 ENCOUNTER — Encounter: Payer: Self-pay | Admitting: *Deleted

## 2010-07-17 ENCOUNTER — Ambulatory Visit (INDEPENDENT_AMBULATORY_CARE_PROVIDER_SITE_OTHER)
Admission: RE | Admit: 2010-07-17 | Discharge: 2010-07-17 | Disposition: A | Discharge status: Home / Self Care | Payer: Medicare Other | Source: Ambulatory Visit | Attending: Internal Medicine | Admitting: Internal Medicine

## 2010-07-17 VITALS — BP 120/80 | HR 67 | Ht 70.0 in | Wt 222.4 lb

## 2010-07-17 DIAGNOSIS — J301 Allergic rhinitis due to pollen: Secondary | ICD-10-CM | POA: Insufficient documentation

## 2010-07-17 DIAGNOSIS — R062 Wheezing: Secondary | ICD-10-CM

## 2010-07-17 NOTE — Patient Instructions (Signed)
I understand the arthritis pains bother you a lot. Suggest you ask Dr Everardo All if it would help to see a rheumatologist.  For the wheeze and nose symptoms-    CXR -- for dx wheeze    Allergy profile-- for dx rhinitis due to pollen

## 2010-07-17 NOTE — Assessment & Plan Note (Signed)
This sounds like mild asthma. We will get CXR and PFT
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
## 2010-07-17 NOTE — Assessment & Plan Note (Addendum)
It sounds as if he may have enough rhinorhea and nasal congestion to be a problem, at least seasonally. We will send allergy profile to start. He gives vague description of pain in right ear/ face that may include some trigeminal neuralgia and or eustachian dysfunction.

## 2010-07-18 LAB — ALLERGY FULL PROFILE
Allergen,Goose feathers, e70: <0.35 kU/L (ref <0.35)
Alternaria Alternata: <0.35 kU/L (ref <0.35)
Aspergillus fumigatus, IgG: <0.35 kU/L (ref <0.35)
Bermuda Grass: <0.35 kU/L (ref <0.35)
Candida Albicans: <0.35 kU/L (ref <0.35)
D. farinae: <0.35 kU/L (ref <0.35)
Fescue: <0.35 kU/L (ref <0.35)
IgE (Immunoglobulin E), Serum: 16.1 IU/mL (ref 0.0–180.0)
Lamb's Quarters: <0.35 kU/L (ref <0.35)
Oak: <0.35 kU/L (ref <0.35)
Plantain: <0.35 kU/L (ref <0.35)
Stemphylium Botryosum: <0.35 kU/L (ref <0.35)
Timothy Grass: <0.35 kU/L (ref <0.35)

## 2010-07-27 ENCOUNTER — Encounter: Payer: Self-pay | Admitting: Internal Medicine

## 2010-08-09 NOTE — Progress Notes (Signed)
Quick Note:  Pt aware of results. ______ 

## 2010-08-24 ENCOUNTER — Ambulatory Visit: Payer: Medicare Other | Admitting: Internal Medicine

## 2010-10-25 ENCOUNTER — Other Ambulatory Visit: Payer: Self-pay | Admitting: Endocrinology

## 2010-10-25 ENCOUNTER — Ambulatory Visit: Payer: Medicare Other | Admitting: Internal Medicine

## 2010-11-24 ENCOUNTER — Other Ambulatory Visit: Payer: Self-pay | Admitting: Endocrinology

## 2010-12-18 ENCOUNTER — Other Ambulatory Visit: Payer: Self-pay | Admitting: Endocrinology

## 2010-12-18 NOTE — Telephone Encounter (Signed)
Done

## 2011-01-18 ENCOUNTER — Other Ambulatory Visit: Payer: Self-pay | Admitting: Endocrinology

## 2011-02-07 ENCOUNTER — Other Ambulatory Visit: Payer: Self-pay | Admitting: Endocrinology

## 2011-03-19 ENCOUNTER — Ambulatory Visit (INDEPENDENT_AMBULATORY_CARE_PROVIDER_SITE_OTHER): Payer: Medicare Other | Admitting: Endocrinology

## 2011-03-19 ENCOUNTER — Encounter: Payer: Self-pay | Admitting: Endocrinology

## 2011-03-19 DIAGNOSIS — R062 Wheezing: Secondary | ICD-10-CM

## 2011-03-19 NOTE — Progress Notes (Signed)
  Subjective:    Patient ID: Miguel Blake, male    DOB: 05-01-1932, 78 y.o.   MRN: [id number]  HPI Pt states few weeks of slight wheezing in the chest, and assoc dry-quality cough.   Past Medical History  Diagnosis Date  . DEMENTIA 09/15/2006  . HYPERTENSION 09/15/2006  . OSTEOARTHRITIS 09/15/2006  . BENIGN PROSTATIC HYPERTROPHY, WITH OBSTRUCTION 05/11/2008  . 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 HYPERCHOLESTEROLEMIA 05/11/2008  . Increased prostate specific antigen (PSA) velocity   . Non-cardiogenic pulmonary edema     Past Surgical History  Procedure Date  . Exercise stress cardiolite 01/17/2003  . Doppler echocardiography 08/27/2001  . Electrocardiogram 11/21/2005  . Appendectomy unsure of date    History   Social History  . Marital Status: Legally Separated    Spouse Name: N/A    Number of Children: N/A  . Years of Education: 10th grade   Occupational History  .      Retired   Social History Main Topics  . Smoking status: Former Smoker    Types: Cigarettes  . Smokeless tobacco: Not on file  . Alcohol Use: No     Quit drinking-heavy drinker  . Drug Use: No  . Sexually Active: Not on file   Other Topics Concern  . Not on file   Social History Narrative   Retired Producer, television/film/video    Current Outpatient Prescriptions on File Prior to Visit  Medication Sig Dispense Refill  . citalopram (CELEXA) 20 MG tablet TAKE 1 TABLET BY MOUTH EVERY DAY  30 tablet  6  . ezetimibe (ZETIA) 10 MG tablet Take 10 mg by mouth daily.        . finasteride (PROSCAR) 5 MG tablet TAKE ONE TABLET BY MOUTH DAILY  30 tablet  5  . HYDROcodone-acetaminophen (NORCO) 5-325 MG per tablet 1/2-1 tablet every 4 hours as needed for pain       . losartan-hydrochlorothiazide (HYZAAR) 50-12.5 MG per tablet TAKE 1 & 1/2 TABLETS BY MOUTH EVERY DAY  45 tablet  4  . Tamsulosin HCl (FLOMAX) 0.4 MG CAPS Take 0.4 mg by mouth daily.          Allergies    Allergen Reactions  . Enalapril Maleate   . Fluvastatin Sodium     REACTION: Myalgia  . Penicillins     Family History  Problem Relation Age of Onset  . Cancer Father     Prostate Cancer    BP 122/80  Pulse 56  Temp(Src) 97 F (36.1 C) (Oral)  Ht 5\' 10"  (1.778 m)  Wt 237 lb 6 oz (107.673 kg)  BMI 34.06 kg/m2  SpO2 96%   Review of Systems Denies fever and sob.      Objective:   Physical Exam VITAL SIGNS:  See vs page GENERAL: no distress head: no deformity eyes: no periorbital swelling, no proptosis external nose and ears are normal mouth: no lesion seen LUNGS:  Clear to auscultation      Assessment & Plan:  Asthma, mild, new

## 2011-03-19 NOTE — Patient Instructions (Addendum)
here is a sample of "advair-250."  take 1 puff 2x a day.  rinse mouth after using. Call if this helps, so i can send a prescription to your pharmacy.

## 2011-03-25 ENCOUNTER — Telehealth: Payer: Self-pay | Admitting: Endocrinology

## 2011-03-25 NOTE — Telephone Encounter (Signed)
Pt requesting advair inhaler 250-50 called in  --[facility]--[facility] don't know which [facility]---

## 2011-03-26 MED ORDER — FLUTICASONE-SALMETEROL 100-50 MCG/DOSE IN AEPB: 1.0000 | INHALATION_SPRAY | Freq: Two times a day (BID) | RESPIRATORY_TRACT | Status: DC

## 2011-03-26 NOTE — Telephone Encounter (Signed)
A sample was given at last OV, okay to fill?

## 2011-03-26 NOTE — Telephone Encounter (Signed)
i sent

## 2011-04-22 ENCOUNTER — Other Ambulatory Visit: Payer: Self-pay | Admitting: Endocrinology

## 2011-05-17 ENCOUNTER — Other Ambulatory Visit: Payer: Self-pay | Admitting: Endocrinology

## 2011-05-23 ENCOUNTER — Other Ambulatory Visit: Payer: Self-pay | Admitting: Endocrinology

## 2011-08-06 ENCOUNTER — Other Ambulatory Visit: Payer: Self-pay | Admitting: Endocrinology

## 2011-08-19 ENCOUNTER — Ambulatory Visit: Payer: Medicare Other | Admitting: Endocrinology

## 2011-08-19 DIAGNOSIS — Z0289 Encounter for other administrative examinations: Secondary | ICD-10-CM

## 2011-08-23 ENCOUNTER — Other Ambulatory Visit (INDEPENDENT_AMBULATORY_CARE_PROVIDER_SITE_OTHER): Payer: Medicare Other

## 2011-08-23 ENCOUNTER — Other Ambulatory Visit: Payer: Self-pay | Admitting: *Deleted

## 2011-08-23 DIAGNOSIS — N401 Enlarged prostate with lower urinary tract symptoms: Secondary | ICD-10-CM

## 2011-08-23 DIAGNOSIS — E7800 Pure hypercholesterolemia, unspecified: Secondary | ICD-10-CM

## 2011-08-23 DIAGNOSIS — N138 Other obstructive and reflux uropathy: Secondary | ICD-10-CM

## 2011-08-23 DIAGNOSIS — Z0000 Encounter for general adult medical examination without abnormal findings: Secondary | ICD-10-CM

## 2011-08-23 LAB — LDL CHOLESTEROL, DIRECT: Direct LDL: 137.5 mg/dL

## 2011-08-23 LAB — HEPATIC FUNCTION PANEL
Albumin: 3.5 g/dL (ref 3.5–5.2)
Alkaline Phosphatase: 54 U/L (ref 39–117)
Bilirubin, Direct: 0 mg/dL (ref 0.0–0.3)

## 2011-08-23 LAB — CBC WITH DIFFERENTIAL/PLATELET
Basophils Absolute: 0 10*3/uL (ref 0.0–0.1)
HCT: 42.2 % (ref 39.0–52.0)
Hemoglobin: 13.5 g/dL (ref 13.0–17.0)
Lymphs Abs: 1.7 10*3/uL (ref 0.7–4.0)
MCV: 88.7 fl (ref 78.0–100.0)
Monocytes Absolute: 0.6 10*3/uL (ref 0.1–1.0)
Neutro Abs: 2.9 10*3/uL (ref 1.4–7.7)
Platelets: 184 10*3/uL (ref 150.0–400.0)
RDW: 14.9 % — ABNORMAL HIGH (ref 11.5–14.6)

## 2011-08-23 LAB — LIPID PANEL
HDL: 44.1 mg/dL (ref >39.00)
Triglycerides: 91 mg/dL (ref 0.0–149.0)
VLDL: 18.2 mg/dL (ref 0.0–40.0)

## 2011-08-23 LAB — URINALYSIS, ROUTINE W REFLEX MICROSCOPIC
Bilirubin Urine: NEGATIVE
Ketones, ur: NEGATIVE
Leukocytes, UA: NEGATIVE
Total Protein, Urine: NEGATIVE
pH: 6 (ref 5.0–8.0)

## 2011-08-23 LAB — BASIC METABOLIC PANEL
Calcium: 9.3 mg/dL (ref 8.4–10.5)
Creatinine, Ser: 1 mg/dL (ref 0.4–1.5)
GFR: 90.08 mL/min (ref >60.00)
Glucose, Bld: 112 mg/dL — ABNORMAL HIGH (ref 70–99)
Sodium: 136 mEq/L (ref 135–145)

## 2011-08-23 LAB — PSA: PSA: 3.17 ng/mL (ref 0.10–4.00)

## 2011-08-28 ENCOUNTER — Encounter: Payer: Self-pay | Admitting: Endocrinology

## 2011-08-28 ENCOUNTER — Ambulatory Visit (INDEPENDENT_AMBULATORY_CARE_PROVIDER_SITE_OTHER): Payer: Medicare Other | Admitting: Endocrinology

## 2011-08-28 VITALS — BP 148/82 | HR 67 | Temp 97.7°F | Ht 70.0 in | Wt 239.0 lb

## 2011-08-28 DIAGNOSIS — I10 Essential (primary) hypertension: Secondary | ICD-10-CM

## 2011-08-28 DIAGNOSIS — R0602 Shortness of breath: Secondary | ICD-10-CM
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
## 2011-08-28 NOTE — Patient Instructions (Addendum)
please consider these measures for your health:  minimize alcohol.  do not use tobacco products.  have a colonoscopy at least every 10 years from age 78.  keep firearms safely stored.  always use seat belts.  have working smoke alarms in your home.  see an eye doctor and dentist regularly.  never drive under the influence of alcohol or drugs (including prescription drugs).   Please call if you decide to do the colonoscopy test.  This reduces your chances of dying from cancer.   You should have a vaccine against shingles (a painful rash which results from the  chickenpox infection which most people had many years ago).  This vaccine reduces, but does not totally eliminate the risk of shingles.  Because this is a medicare part d benefit, you should get it at a pharmacy.   Please return in 1 year. (update: we discussed code status.  pt requests full code, but would not want to be started or maintained on artificial life-support measures if there was not a reasonable chance of recovery)

## 2011-10-03 ENCOUNTER — Other Ambulatory Visit: Payer: Self-pay | Admitting: *Deleted

## 2011-10-03 MED ORDER — EZETIMIBE 10 MG PO TABS: ORAL_TABLET | ORAL | Status: DC

## 2011-10-03 NOTE — Telephone Encounter (Signed)
R'cd fax from [facility] for refill of Zetia.

## 2011-10-23 ENCOUNTER — Other Ambulatory Visit: Payer: Self-pay | Admitting: *Deleted

## 2011-10-23 MED ORDER — LOSARTAN POTASSIUM-HCTZ 50-12.5 MG PO TABS: ORAL_TABLET | ORAL | Status: DC

## 2011-10-23 NOTE — Telephone Encounter (Signed)
R'cd fax from [facility] for refill of Losartan/HCTZ refill

## 2011-10-28 ENCOUNTER — Other Ambulatory Visit: Payer: Self-pay | Admitting: *Deleted

## 2011-10-28 MED ORDER — CITALOPRAM HYDROBROMIDE 20 MG PO TABS: ORAL_TABLET | ORAL | Status: DC

## 2011-10-28 NOTE — Telephone Encounter (Signed)
R'cd fax from [facility] for refill of Celexa.

## 2012-02-10 ENCOUNTER — Other Ambulatory Visit: Payer: Self-pay | Admitting: Endocrinology

## 2012-02-24 ENCOUNTER — Telehealth (INDEPENDENT_AMBULATORY_CARE_PROVIDER_SITE_OTHER): Payer: Self-pay

## 2012-02-24 NOTE — Telephone Encounter (Signed)
I called pt to discuss upcoming appt. Pt states he has not seen his PCP or had any other work up for is abd discomfort. Pt advised he needs to see his pcp and have his abd pain evaluated prior to seeing a surgeon. Pt states he understands and will make appt with Dr Everardo All. Pt advised we are glad to see him in future if he has a surgical issue.

## 2012-03-02 ENCOUNTER — Ambulatory Visit (INDEPENDENT_AMBULATORY_CARE_PROVIDER_SITE_OTHER): Payer: Self-pay | Admitting: Surgery

## 2012-03-20 ENCOUNTER — Other Ambulatory Visit: Payer: Self-pay | Admitting: Endocrinology

## 2012-03-25 ENCOUNTER — Ambulatory Visit (INDEPENDENT_AMBULATORY_CARE_PROVIDER_SITE_OTHER): Payer: Medicare Other | Admitting: Endocrinology

## 2012-03-25 ENCOUNTER — Encounter: Payer: Self-pay | Admitting: Endocrinology

## 2012-03-25 VITALS — BP 126/80 | HR 80 | Wt 239.0 lb

## 2012-03-25 DIAGNOSIS — R109 Unspecified abdominal pain: Secondary | ICD-10-CM

## 2012-03-25 LAB — URINALYSIS, ROUTINE W REFLEX MICROSCOPIC
Bilirubin Urine: NEGATIVE
Nitrite: NEGATIVE
Total Protein, Urine: NEGATIVE
Urine Glucose: NEGATIVE
pH: 6.5 (ref 5.0–8.0)

## 2012-03-25 LAB — CBC WITH DIFFERENTIAL/PLATELET
Basophils Relative: 0.6 % (ref 0.0–3.0)
Eosinophils Relative: 2.7 % (ref 0.0–5.0)
HCT: 39.9 % (ref 39.0–52.0)
Hemoglobin: 13 g/dL (ref 13.0–17.0)
Lymphs Abs: 1.6 10*3/uL (ref 0.7–4.0)
MCV: 85.8 fl (ref 78.0–100.0)
Monocytes Absolute: 0.5 10*3/uL (ref 0.1–1.0)
Monocytes Relative: 10.6 % (ref 3.0–12.0)
Neutro Abs: 2.4 10*3/uL (ref 1.4–7.7)
RBC: 4.66 Mil/uL (ref 4.22–5.81)
WBC: 4.7 10*3/uL (ref 4.5–10.5)

## 2012-03-25 LAB — AMYLASE: Amylase: 183 U/L — ABNORMAL HIGH (ref 27–131)

## 2012-03-25 LAB — BASIC METABOLIC PANEL
BUN: 12 mg/dL (ref 6–23)
Chloride: 106 mEq/L (ref 96–112)
GFR: 76.88 mL/min (ref >60.00)
Potassium: 3.7 mEq/L (ref 3.5–5.1)
Sodium: 137 mEq/L (ref 135–145)

## 2012-03-25 MED ORDER — LACTULOSE 10 GM/15ML PO SOLN: 20.0000 g | Freq: Three times a day (TID) | ORAL | Status: DC

## 2012-03-25 NOTE — Progress Notes (Signed)
  Subjective:    Patient ID: Miguel Blake, male    DOB: 06/18/1932, 79 y.o.   MRN: [id number]  HPI Pt states few weeks of slight pain at the LLQ, near the umbilicus.  No assoc n/v.  He says he had not had this before.  Past Medical History  Diagnosis Date  . DEMENTIA 09/15/2006  . HYPERTENSION 09/15/2006  . OSTEOARTHRITIS 09/15/2006  . BENIGN PROSTATIC HYPERTROPHY, WITH OBSTRUCTION 05/11/2008  . 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 HYPERCHOLESTEROLEMIA 05/11/2008  . Increased prostate specific antigen (PSA) velocity   . Non-cardiogenic pulmonary edema     Past Surgical History  Procedure Date  . Exercise stress cardiolite 01/17/2003  . Doppler echocardiography 08/27/2001  . Electrocardiogram 11/21/2005  . Appendectomy unsure of date    History   Social History  . Marital Status: Legally Separated    Spouse Name: N/A    Number of Children: N/A  . Years of Education: 10th grade   Occupational History  .      Retired   Social History Main Topics  . Smoking status: Former Smoker    Types: Cigarettes  . Smokeless tobacco: Not on file  . Alcohol Use: No     Comment: Quit drinking-heavy drinker  . Drug Use: No  . Sexually Active: Not on file   Other Topics Concern  . Not on file   Social History Narrative   Retired Producer, television/film/video    Current Outpatient Prescriptions on File Prior to Visit  Medication Sig Dispense Refill  . citalopram (CELEXA) 20 MG tablet TAKE 1 TABLET BY MOUTH EVERY DAY  30 tablet  5  . ezetimibe (ZETIA) 10 MG tablet TAKE 1 TABLET BY MOUTH EVERY DAY  30 tablet  5  . finasteride (PROSCAR) 5 MG tablet TAKE 1 TABLET BY MOUTH EVERY DAY  30 tablet  0  . Fluticasone-Salmeterol (ADVAIR DISKUS) 100-50 MCG/DOSE AEPB Inhale 1 puff into the lungs 2 (two) times daily.  1 each  11  . HYDROcodone-acetaminophen (NORCO) 5-325 MG per tablet 1/2-1 tablet every 4 hours as needed for pain       .  losartan-hydrochlorothiazide (HYZAAR) 50-12.5 MG per tablet TAKE 1 & 1/2 TABLETS BY MOUTH EVERY DAY  45 tablet  5  . meloxicam (MOBIC) 7.5 MG tablet Take 7.5 mg by mouth daily as needed.       . Tamsulosin HCl (FLOMAX) 0.4 MG CAPS Take 0.4 mg by mouth daily.          Allergies  Allergen Reactions  . Enalapril Maleate   . Fluvastatin Sodium     REACTION: Myalgia  . Penicillins     Family History  Problem Relation Age of Onset  . Cancer Father     Prostate Cancer    BP 126/80  Pulse 80  Wt 239 lb (108.41 kg)  SpO2 94%  Review of Systems Denies diarrhea and BRBPR.      Objective:   Physical Exam VITAL SIGNS:  See vs page GENERAL: no distress ABDOMEN: abdomen is soft, nontender.  no hepatosplenomegaly. not distended.  no hernia.  Old healed surgical scar at the midline.       Assessment & Plan:  abd pain, new, uncertain etiology

## 2012-03-25 NOTE — Patient Instructions (Addendum)
blood tests are being requested for you today.  We'll contact you with results.   Let's also check a CT scan.  you will receive a phone call, about a day and time for an appointment.   I hope you feel better soon.  If you don't feel better by next week, please call back.  Please call sooner if you get worse. i have sent a prescription to your pharmacy, to help the bowels move.

## 2012-03-26 ENCOUNTER — Telehealth: Payer: Self-pay | Admitting: Endocrinology

## 2012-03-26 NOTE — Telephone Encounter (Signed)
Patient was told to contact us regarding instruction on how to take his contrast for his CT on Monday.  Please call him.

## 2012-03-26 NOTE — Telephone Encounter (Signed)
PATIENT INSTRUCTED TO CALL [facility] ON [address] WHERE HE WAS GIVEN THE FLUID TO GET INSTRUCTIONS FOR THIS.Marland Kitchen

## 2012-03-26 NOTE — Telephone Encounter (Signed)
Pt needs to know how to prep for his scan. Says he is unsure how to take the fluid / srs

## 2012-03-30 ENCOUNTER — Ambulatory Visit (INDEPENDENT_AMBULATORY_CARE_PROVIDER_SITE_OTHER)
Admission: RE | Admit: 2012-03-30 | Discharge: 2012-03-30 | Disposition: A | Discharge status: Home / Self Care | Payer: Medicare Other | Source: Ambulatory Visit | Attending: Endocrinology | Admitting: Endocrinology

## 2012-03-30 DIAGNOSIS — R109 Unspecified abdominal pain: Secondary | ICD-10-CM

## 2012-03-30 MED ORDER — IOHEXOL 300 MG/ML  SOLN
100.0000 mL | Freq: Once | INTRAMUSCULAR | Status: AC | PRN
  Administered 2012-03-30: 100 mL via INTRAVENOUS

## 2012-04-09 ENCOUNTER — Other Ambulatory Visit: Payer: Self-pay

## 2012-04-09 MED ORDER — LOSARTAN POTASSIUM-HCTZ 50-12.5 MG PO TABS: ORAL_TABLET | ORAL | Status: DC

## 2012-04-24 ENCOUNTER — Other Ambulatory Visit: Payer: Self-pay | Admitting: Endocrinology

## 2012-04-25 ENCOUNTER — Other Ambulatory Visit: Payer: Self-pay | Admitting: Endocrinology

## 2012-04-29 ENCOUNTER — Other Ambulatory Visit: Payer: Self-pay | Admitting: *Deleted

## 2012-04-29 MED ORDER — CITALOPRAM HYDROBROMIDE 20 MG PO TABS: ORAL_TABLET | ORAL | Status: DC

## 2012-05-06 ENCOUNTER — Other Ambulatory Visit: Payer: Self-pay | Admitting: *Deleted

## 2012-05-06 MED ORDER — EZETIMIBE 10 MG PO TABS: 10.0000 mg | ORAL_TABLET | Freq: Every day | ORAL | Status: DC

## 2012-05-20 ENCOUNTER — Other Ambulatory Visit: Payer: Self-pay | Admitting: *Deleted

## 2012-05-20 ENCOUNTER — Other Ambulatory Visit: Payer: Self-pay | Admitting: Endocrinology

## 2012-05-20 MED ORDER — CITALOPRAM HYDROBROMIDE 20 MG PO TABS: ORAL_TABLET | ORAL | Status: DC

## 2012-05-27 ENCOUNTER — Other Ambulatory Visit: Payer: Self-pay | Admitting: *Deleted

## 2012-05-27 MED ORDER — LOSARTAN POTASSIUM-HCTZ 50-12.5 MG PO TABS: 1.0000 | ORAL_TABLET | Freq: Every day | ORAL | Status: DC

## 2012-05-29 ENCOUNTER — Other Ambulatory Visit: Payer: Self-pay | Admitting: *Deleted

## 2012-05-29 ENCOUNTER — Other Ambulatory Visit: Payer: Self-pay | Admitting: Endocrinology

## 2012-05-29 MED ORDER — FINASTERIDE 5 MG PO TABS: 5.0000 mg | ORAL_TABLET | Freq: Every day | ORAL | Status: DC

## 2012-06-18 ENCOUNTER — Other Ambulatory Visit: Payer: Self-pay | Admitting: Endocrinology

## 2012-09-27 ENCOUNTER — Other Ambulatory Visit: Payer: Self-pay | Admitting: Endocrinology

## 2012-09-28 ENCOUNTER — Other Ambulatory Visit: Payer: Self-pay | Admitting: *Deleted

## 2012-09-28 MED ORDER — FINASTERIDE 5 MG PO TABS: 5.0000 mg | ORAL_TABLET | Freq: Every day | ORAL | Status: DC

## 2012-10-16 ENCOUNTER — Other Ambulatory Visit: Payer: Self-pay | Admitting: Endocrinology

## 2012-10-22 ENCOUNTER — Other Ambulatory Visit: Payer: Self-pay | Admitting: Endocrinology

## 2012-10-22 NOTE — Telephone Encounter (Signed)
Refill x 1 cpx is due 

## 2012-11-14 ENCOUNTER — Other Ambulatory Visit: Payer: Self-pay | Admitting: Endocrinology

## 2012-11-17 ENCOUNTER — Other Ambulatory Visit: Payer: Self-pay

## 2012-11-17 ENCOUNTER — Other Ambulatory Visit (INDEPENDENT_AMBULATORY_CARE_PROVIDER_SITE_OTHER): Payer: Medicare Other

## 2012-11-17 DIAGNOSIS — E7800 Pure hypercholesterolemia, unspecified: Secondary | ICD-10-CM

## 2012-11-17 DIAGNOSIS — Z0000 Encounter for general adult medical examination without abnormal findings: Secondary | ICD-10-CM

## 2012-11-17 DIAGNOSIS — Z0389 Encounter for observation for other suspected diseases and conditions ruled out: Secondary | ICD-10-CM

## 2012-11-17 DIAGNOSIS — I10 Essential (primary) hypertension: Secondary | ICD-10-CM

## 2012-11-17 LAB — CBC WITH DIFFERENTIAL/PLATELET
Eosinophils Relative: 5.3 % — ABNORMAL HIGH (ref 0.0–5.0)
Lymphocytes Relative: 31.6 % (ref 12.0–46.0)
MCV: 85.9 fl (ref 78.0–100.0)
Monocytes Absolute: 1 10*3/uL (ref 0.1–1.0)
Neutrophils Relative %: 48.1 % (ref 43.0–77.0)
Platelets: 241 10*3/uL (ref 150.0–400.0)
WBC: 6.6 10*3/uL (ref 4.5–10.5)

## 2012-11-17 LAB — BASIC METABOLIC PANEL
BUN: 15 mg/dL (ref 6–23)
Chloride: 106 mEq/L (ref 96–112)
GFR: 79.87 mL/min (ref >60.00)
Glucose, Bld: 113 mg/dL — ABNORMAL HIGH (ref 70–99)
Potassium: 3.8 mEq/L (ref 3.5–5.1)

## 2012-11-17 LAB — URINALYSIS, ROUTINE W REFLEX MICROSCOPIC
Nitrite: NEGATIVE
RBC / HPF: NONE SEEN (ref 0–?)
Specific Gravity, Urine: 1.025 (ref 1.000–1.030)
Total Protein, Urine: NEGATIVE
pH: 6 (ref 5.0–8.0)

## 2012-11-17 LAB — HEPATIC FUNCTION PANEL
ALT: 19 U/L (ref 0–53)
Bilirubin, Direct: 0 mg/dL (ref 0.0–0.3)
Total Bilirubin: 0.5 mg/dL (ref 0.3–1.2)

## 2012-11-17 LAB — LDL CHOLESTEROL, DIRECT: Direct LDL: 140.7 mg/dL

## 2012-11-17 LAB — PSA: PSA: 3.66 ng/mL (ref 0.10–4.00)

## 2012-11-17 LAB — LIPID PANEL
HDL: 42.8 mg/dL (ref >39.00)
VLDL: 16.6 mg/dL (ref 0.0–40.0)

## 2012-11-20 ENCOUNTER — Other Ambulatory Visit: Payer: Self-pay | Admitting: *Deleted

## 2012-11-20 MED ORDER — EZETIMIBE 10 MG PO TABS: 10.0000 mg | ORAL_TABLET | Freq: Every day | ORAL | Status: DC

## 2012-11-24 ENCOUNTER — Ambulatory Visit
Admission: RE | Admit: 2012-11-24 | Discharge: 2012-11-24 | Disposition: A | Discharge status: Home / Self Care | Payer: Medicare Other | Source: Ambulatory Visit | Attending: Endocrinology | Admitting: Endocrinology

## 2012-11-24 ENCOUNTER — Encounter: Payer: Self-pay | Admitting: Endocrinology

## 2012-11-24 ENCOUNTER — Ambulatory Visit (INDEPENDENT_AMBULATORY_CARE_PROVIDER_SITE_OTHER): Payer: Medicare Other | Admitting: Endocrinology

## 2012-11-24 VITALS — BP 130/80 | HR 74 | Ht 70.0 in | Wt 243.0 lb

## 2012-11-24 DIAGNOSIS — J301 Allergic rhinitis due to pollen: Secondary | ICD-10-CM

## 2012-11-24 DIAGNOSIS — R062 Wheezing: Secondary | ICD-10-CM

## 2012-11-24 MED ORDER — SILDENAFIL CITRATE 20 MG PO TABS: ORAL_TABLET | ORAL | Status: DC

## 2012-11-24 MED ORDER — EZETIMIBE 10 MG PO TABS: 10.0000 mg | ORAL_TABLET | Freq: Every day | ORAL | Status: DC

## 2012-11-24 MED ORDER — CITALOPRAM HYDROBROMIDE 20 MG PO TABS: 20.0000 mg | ORAL_TABLET | Freq: Every day | ORAL | Status: DC

## 2012-11-24 MED ORDER — TAMSULOSIN HCL 0.4 MG PO CAPS: 0.4000 mg | ORAL_CAPSULE | Freq: Every day | ORAL | Status: DC

## 2012-11-24 MED ORDER — LOSARTAN POTASSIUM-HCTZ 50-12.5 MG PO TABS: 1.0000 | ORAL_TABLET | Freq: Every day | ORAL | Status: DC

## 2012-11-24 MED ORDER — FLUTICASONE PROPIONATE 50 MCG/ACT NA SUSP: 2.0000 | Freq: Every day | NASAL | Status: DC

## 2012-11-24 MED ORDER — FINASTERIDE 5 MG PO TABS: 5.0000 mg | ORAL_TABLET | Freq: Every day | ORAL | Status: DC

## 2012-11-24 NOTE — Patient Instructions (Addendum)
please consider these measures for your health:  minimize alcohol.  do not use tobacco products.  have a colonoscopy at least every 10 years from age 79.  keep firearms safely stored.  always use seat belts.  have working smoke alarms in your home.  see an eye doctor and dentist regularly.  never drive under the influence of alcohol or drugs (including prescription drugs).   please let me know what your wishes would be, if artificial life support measures should become necessary.  it is critically important to prevent falling down (keep floor areas well-lit, dry, and free of loose objects.  If you have a cane, walker, or wheelchair, you should use it, even for short trips around the house.  Also, try not to rush).   Please stop the finasteride, and resume the tamsulosin.  i have sent a prescription to your pharmacy. A chest-x-ray is requested for you today.  We'll contact you with results.   i have sent a prescription to your pharmacy, for a nasal spray. Please come back for a follow-up appointment in 6 months.  i have sent a prescription to your pharmacy, for "viagra." Call if you decide to get the colonoscopy.  This reduces your chances of getting cancer.

## 2012-11-24 NOTE — Progress Notes (Signed)
Subjective:    Patient ID: Miguel Blake, male    DOB: 10/23/1932, 79 y.o.   MRN: [id number]  HPI Pt is here for regular wellness examination, and is feeling pretty well in general, and says chronic med probs are stable, except as noted below Past Medical History  Diagnosis Date  . DEMENTIA 09/15/2006  . HYPERTENSION 09/15/2006  . OSTEOARTHRITIS 09/15/2006  . BENIGN PROSTATIC HYPERTROPHY, WITH OBSTRUCTION 05/11/2008  . 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 HYPERCHOLESTEROLEMIA 05/11/2008  . Increased prostate specific antigen (PSA) velocity   . Non-cardiogenic pulmonary edema     Past Surgical History  Procedure Laterality Date  . Exercise stress cardiolite  01/17/2003  . Doppler echocardiography  08/27/2001  . Electrocardiogram  11/21/2005  . Appendectomy  unsure of date    History   Social History  . Marital Status: Legally Separated    Spouse Name: N/A    Number of Children: N/A  . Years of Education: 10th grade   Occupational History  .      Retired   Social History Main Topics  . Smoking status: Former Smoker    Types: Cigarettes  . Smokeless tobacco: Not on file  . Alcohol Use: No     Comment: Quit drinking-heavy drinker  . Drug Use: No  . Sexual Activity: Not on file   Other Topics Concern  . Not on file   Social History Narrative   Retired Producer, television/film/video   No current outpatient prescriptions on file prior to visit.   No current facility-administered medications on file prior to visit.   Allergies  Allergen Reactions  . Enalapril Maleate   . Fluvastatin Sodium     REACTION: Myalgia  . Penicillins    Family History  Problem Relation Age of Onset  . Cancer Father     Prostate Cancer   BP 130/80  Pulse 74  Ht 5\' 10"  (1.778 m)  Wt 243 lb (110.224 kg)  BMI 34.87 kg/m2  SpO2 94%  Review of Systems  Constitutional: Negative for fever.       He has weight gain  HENT: Negative for hearing  loss.   Eyes: Negative for visual disturbance.  Respiratory: Negative for shortness of breath.   Cardiovascular: Negative for chest pain.  Gastrointestinal: Negative for anal bleeding.  Genitourinary: Negative for hematuria and difficulty urinating.  Musculoskeletal: Negative for back pain.  Skin: Negative for rash.  Neurological: Negative for syncope and numbness.  Hematological: Does not bruise/bleed easily.  Psychiatric/Behavioral: Negative for dysphoric mood.       Objective:   Physical Exam VS: see vs page GEN: no distress NECK: supple, thyroid is not enlarged CHEST WALL: no deformity LUNGS: clear to auscultation BREASTS:  No gynecomastia CV: reg rate and rhythm, no murmur ABD: abdomen is soft, nontender.  no hepatosplenomegaly.  not distended.  no hernia GENITALIA:  Normal male.   RECTAL: normal external and internal exam.  heme neg. PROSTATE:  Normal size.  No nodule MUSCULOSKELETAL: muscle bulk and strength are grossly normal.  no obvious joint swelling.  gait is normal and steady PULSES:  no carotid bruit NEURO:  cn 2-12 grossly intact.   readily moves all 4's.   SKIN:  Normal texture and temperature.  No rash or suspicious lesion is visible.   NODES:  None palpable at the neck PSYCH: alert, oriented x3.  Does not appear anxious nor depressed.       Assessment &  Plan:  Wellness visit today, with problems stable, except as noted. we discussed code status.  pt requests full code, but would not want to be started or maintained on artificial life-support measures if there was not a reasonable chance of recovery    SEPARATE EVALUATION FOLLOWS--EACH PROBLEM HERE IS NEW, NOT RESPONDING TO TREATMENT, OR POSES SIGNIFICANT RISK TO THE PATIENT'S HEALTH: HISTORY OF THE PRESENT ILLNESS: Pt states few mos of slight congestion in the nose, and assoc wheezing.   PAST MEDICAL HISTORY reviewed and up to date today REVIEW OF SYSTEMS: Denies earache.  Ed sxs persist (he stopped  flomax, but continued finasteride).   PHYSICAL EXAMINATION: VITAL SIGNS:  See vs page GENERAL: no distress head: no deformity eyes: no periorbital swelling, no proptosis external nose and ears are normal mouth: no lesion seen IMPRESSION: Allergic rhinitis, new BPH: well-controlled ED: he needs increased rx PLAN: See instruction page

## 2012-12-25 ENCOUNTER — Ambulatory Visit (INDEPENDENT_AMBULATORY_CARE_PROVIDER_SITE_OTHER): Payer: Medicare Other | Admitting: Endocrinology

## 2012-12-25 ENCOUNTER — Encounter: Payer: Self-pay | Admitting: Endocrinology

## 2012-12-25 VITALS — BP 160/100 | HR 64 | Temp 97.6°F | Ht 70.0 in | Wt 244.8 lb

## 2012-12-25 DIAGNOSIS — R42 Dizziness and giddiness: Secondary | ICD-10-CM

## 2012-12-25 LAB — BASIC METABOLIC PANEL
Calcium: 9.7 mg/dL (ref 8.4–10.5)
Chloride: 102 mEq/L (ref 96–112)
GFR: 81.49 mL/min (ref >60.00)
Glucose, Bld: 108 mg/dL — ABNORMAL HIGH (ref 70–99)
Potassium: 4.1 mEq/L (ref 3.5–5.1)
Sodium: 135 mEq/L (ref 135–145)

## 2012-12-25 LAB — CBC WITH DIFFERENTIAL/PLATELET
Eosinophils Relative: 2.2 % (ref 0.0–5.0)
HCT: 42.8 % (ref 39.0–52.0)
Hemoglobin: 14.1 g/dL (ref 13.0–17.0)
Lymphocytes Relative: 27.7 % (ref 12.0–46.0)
Lymphs Abs: 1.7 10*3/uL (ref 0.7–4.0)
MCV: 84.6 fl (ref 78.0–100.0)
Monocytes Relative: 11.2 % (ref 3.0–12.0)
Neutro Abs: 3.6 10*3/uL (ref 1.4–7.7)
RBC: 5.06 Mil/uL (ref 4.22–5.81)
RDW: 14.8 % — ABNORMAL HIGH (ref 11.5–14.6)
WBC: 6.2 10*3/uL (ref 4.5–10.5)

## 2012-12-25 MED ORDER — TRAMADOL HCL 50 MG PO TABS: 50.0000 mg | ORAL_TABLET | Freq: Four times a day (QID) | ORAL | Status: DC | PRN

## 2012-12-25 NOTE — Progress Notes (Signed)
Subjective:    Patient ID: Miguel Blake, male    DOB: 11/25/1932, 79 y.o.   MRN: [id number]  HPI Pt states 1 day of moderate vertigenous-quality dizziness sensation in the head, but no assoc LOC.  Past Medical History  Diagnosis Date  . DEMENTIA 09/15/2006  . HYPERTENSION 09/15/2006  . OSTEOARTHRITIS 09/15/2006  . BENIGN PROSTATIC HYPERTROPHY, WITH OBSTRUCTION 05/11/2008  . 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 HYPERCHOLESTEROLEMIA 05/11/2008  . Increased prostate specific antigen (PSA) velocity   . Non-cardiogenic pulmonary edema     Past Surgical History  Procedure Laterality Date  . Exercise stress cardiolite  01/17/2003  . Doppler echocardiography  08/27/2001  . Electrocardiogram  11/21/2005  . Appendectomy  unsure of date    History   Social History  . Marital Status: Legally Separated    Spouse Name: N/A    Number of Children: N/A  . Years of Education: 10th grade   Occupational History  .      Retired   Social History Main Topics  . Smoking status: Former Smoker    Types: Cigarettes  . Smokeless tobacco: Not on file  . Alcohol Use: No     Comment: Quit drinking-heavy drinker  . Drug Use: No  . Sexual Activity: Not on file   Other Topics Concern  . Not on file   Social History Narrative   Retired Producer, television/film/video    Current Outpatient Prescriptions on File Prior to Visit  Medication Sig Dispense Refill  . citalopram (CELEXA) 20 MG tablet Take 1 tablet (20 mg total) by mouth daily.  90 tablet  3  . ezetimibe (ZETIA) 10 MG tablet Take 1 tablet (10 mg total) by mouth daily.  90 tablet  3  . fluticasone (FLONASE) 50 MCG/ACT nasal spray Place 2 sprays into the nose daily.  16 g  6  . losartan-hydrochlorothiazide (HYZAAR) 50-12.5 MG per tablet Take 1 tablet by mouth daily.  90 tablet  3  . sildenafil (REVATIO) 20 MG tablet 3-5 pills as needed for your nature  20 tablet  11  . tamsulosin (FLOMAX) 0.4 MG CAPS  capsule Take 1 capsule (0.4 mg total) by mouth daily.  90 capsule  3   No current facility-administered medications on file prior to visit.   Allergies  Allergen Reactions  . Enalapril Maleate   . Fluvastatin Sodium     REACTION: Myalgia  . Penicillins    Family History  Problem Relation Age of Onset  . Cancer Father     Prostate Cancer   BP 160/100  Pulse 64  Temp(Src) 97.6 F (36.4 C) (Oral)  Ht 5\' 10"  (1.778 m)  Wt 244 lb 12.8 oz (111.041 kg)  BMI 35.13 kg/m2  SpO2 98%   Review of Systems He has slight bifrontal headache, but no blurry vision.    Objective:   Physical Exam VS: see vs page GEN: no distress HEAD: head: no deformity eyes: no periorbital swelling, no proptosis external nose and ears are normal mouth: no lesion seen NECK: supple, thyroid is not enlarged.   CHEST WALL: no deformity. LUNGS: clear to auscultation. BREASTS:  No gynecomastia. CV: reg rate and rhythm, no murmur. MUSCULOSKELETAL: muscle bulk and strength are grossly normal.  no obvious joint swelling.  gait is steady, with a cane.   EXTEMITIES: no deformity. no edema.  PULSES: dorsalis pedis intact bilat.  no carotid bruit.   NEURO:  cn 2-12 grossly  intact.   readily moves all 4's.  sensation is intact to touch on the feet SKIN:  Normal texture and temperature.  No rash or suspicious lesion is visible.   NODES:  None palpable at the neck.   PSYCH: alert, oriented x3.  Does not appear anxious nor depressed.   Lab Results  Component Value Date   WBC 6.2 12/25/2012   HGB 14.1 12/25/2012   HCT 42.8 12/25/2012   MCV 84.6 12/25/2012   PLT 224.0 12/25/2012   Lab Results  Component Value Date   CREATININE 1.1 12/25/2012   BUN 12 12/25/2012   NA 135 12/25/2012   K 4.1 12/25/2012   CL 102 12/25/2012   CO2 27 12/25/2012      Assessment & Plan:  Dizziness, new, uncertain etiology.  This has an extensive ddx.  i told pt cause is usually not a serious med problem.  However, more serious probs such  as CVA or tumor are possible.   HTN: ? Related to headache

## 2012-12-25 NOTE — Patient Instructions (Addendum)
blood tests are being requested for you today.  We'll contact you with results. I hope you feel better soon.  If you don't feel better by next week, please call back.  Please call sooner if you get worse. If this does not get better, i'll request for you the MRI. Here is a prescription for the headache Please continue the same blood pressure medication for now.

## 2013-01-19 ENCOUNTER — Other Ambulatory Visit: Payer: Self-pay | Admitting: Endocrinology

## 2013-02-18 HISTORY — PX: CATARACT EXTRACTION W/ INTRAOCULAR LENS IMPLANT: SHX1309

## 2013-03-04 ENCOUNTER — Ambulatory Visit (INDEPENDENT_AMBULATORY_CARE_PROVIDER_SITE_OTHER): Payer: Medicare Other | Admitting: Endocrinology

## 2013-03-04 ENCOUNTER — Encounter: Payer: Self-pay | Admitting: Endocrinology

## 2013-03-04 ENCOUNTER — Telehealth: Payer: Self-pay

## 2013-03-04 VITALS — BP 122/80 | HR 66 | Temp 97.8°F | Ht 70.0 in | Wt 243.0 lb

## 2013-03-04 DIAGNOSIS — E7800 Pure hypercholesterolemia, unspecified: Secondary | ICD-10-CM

## 2013-03-04 MED ORDER — LOSARTAN POTASSIUM-HCTZ 100-25 MG PO TABS: 1.0000 | ORAL_TABLET | Freq: Every day | ORAL | Status: DC

## 2013-03-04 MED ORDER — TAMSULOSIN HCL 0.4 MG PO CAPS: 0.4000 mg | ORAL_CAPSULE | Freq: Every day | ORAL | Status: DC

## 2013-03-04 NOTE — Telephone Encounter (Signed)
Flomax was sent to [facility] per pt request.

## 2013-03-04 NOTE — Patient Instructions (Addendum)
i have sent a prescription to your pharmacy, to [facility], to refill the flomax. Please also increase the losartan-hctz.  i have sent a prescription to [facility] for this. Please come back for a follow-up appointment in 1 month.

## 2013-03-04 NOTE — Progress Notes (Signed)
   Subjective:    Patient ID: Miguel Blake, male    DOB: 1932-09-12, 80 y.o.   MRN: [id number]  HPI Pt returns for f/u of HTN.  He takes hyzaar as rx'ed, and tolerates well. Pt ran out of flomax, as [facility] is out for now.  He has no prostate sxs since then. Past Medical History  Diagnosis Date  . DEMENTIA 09/15/2006  . HYPERTENSION 09/15/2006  . OSTEOARTHRITIS 09/15/2006  . BENIGN PROSTATIC HYPERTROPHY, WITH OBSTRUCTION 05/11/2008  . 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 HYPERCHOLESTEROLEMIA 05/11/2008  . Increased prostate specific antigen (PSA) velocity   . Non-cardiogenic pulmonary edema     Past Surgical History  Procedure Laterality Date  . Exercise stress cardiolite  01/17/2003  . Doppler echocardiography  08/27/2001  . Electrocardiogram  11/21/2005  . Appendectomy  unsure of date    History   Social History  . Marital Status: Legally Separated    Spouse Name: N/A    Number of Children: N/A  . Years of Education: 10th grade   Occupational History  .      Retired   Social History Main Topics  . Smoking status: Former Smoker    Types: Cigarettes  . Smokeless tobacco: Not on file  . Alcohol Use: No     Comment: Quit drinking-heavy drinker  . Drug Use: No  . Sexual Activity: Not on file   Other Topics Concern  . Not on file   Social History Narrative   Retired Financial controller    Current Outpatient Prescriptions on File Prior to Visit  Medication Sig Dispense Refill  . citalopram (CELEXA) 20 MG tablet Take 1 tablet (20 mg total) by mouth daily.  90 tablet  3  . ezetimibe (ZETIA) 10 MG tablet Take 1 tablet (10 mg total) by mouth daily.  90 tablet  3  . fluticasone (FLONASE) 50 MCG/ACT nasal spray Place 2 sprays into the nose daily.  16 g  6  . FLUVIRIN INJ injection       . sildenafil (REVATIO) 20 MG tablet 3-5 pills as needed for your nature  20 tablet  11  . traMADol (ULTRAM) 50 MG tablet Take 1 tablet  (50 mg total) by mouth every 6 (six) hours as needed (headache).  30 tablet  0   No current facility-administered medications on file prior to visit.    Allergies  Allergen Reactions  . Enalapril Maleate   . Fluvastatin Sodium     REACTION: Myalgia  . Penicillins     Family History  Problem Relation Age of Onset  . Cancer Father     Prostate Cancer    BP 122/80  Pulse 66  Temp(Src) 97.8 F (36.6 C) (Oral)  Ht 5\' 10"  (1.778 m)  Wt 243 lb (110.224 kg)  BMI 34.87 kg/m2  SpO2 96%  Review of Systems Denies chest pain and sob    Objective:   Physical Exam VITAL SIGNS:  See vs page GENERAL: no distress Ext: no edema      Assessment & Plan:  HTN: he needs increased rx BPH: sxs are well-controlled, even off flomax, but he should resume to make sure.

## 2013-05-31 ENCOUNTER — Ambulatory Visit: Payer: Medicare Other | Admitting: Endocrinology

## 2013-06-07 ENCOUNTER — Encounter: Payer: Self-pay | Admitting: Endocrinology

## 2013-06-07 ENCOUNTER — Ambulatory Visit (INDEPENDENT_AMBULATORY_CARE_PROVIDER_SITE_OTHER): Payer: Medicare Other | Admitting: Endocrinology

## 2013-06-07 VITALS — BP 116/74 | HR 78 | Temp 98.6°F | Ht 70.0 in | Wt 242.0 lb

## 2013-06-07 DIAGNOSIS — M542 Cervicalgia: Secondary | ICD-10-CM

## 2013-06-07 MED ORDER — AZITHROMYCIN 500 MG PO TABS: 500.0000 mg | ORAL_TABLET | Freq: Every day | ORAL | Status: DC

## 2013-06-07 MED ORDER — TRAMADOL HCL 50 MG PO TABS: 50.0000 mg | ORAL_TABLET | Freq: Four times a day (QID) | ORAL | Status: DC | PRN

## 2013-06-07 NOTE — Progress Notes (Signed)
Subjective:    Patient ID: Miguel Blake, male    DOB: 11-Nov-1932, 80 y.o.   MRN: [id number]  HPI Pt states 1 week of slight pain at the neck (anterior and posterior), but no assoc prod cough.  Pain is worse at night.   Past Medical History  Diagnosis Date  . DEMENTIA 09/15/2006  . HYPERTENSION 09/15/2006  . OSTEOARTHRITIS 09/15/2006  . BENIGN PROSTATIC HYPERTROPHY, WITH OBSTRUCTION 05/11/2008  . 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 HYPERCHOLESTEROLEMIA 05/11/2008  . Increased prostate specific antigen (PSA) velocity   . Non-cardiogenic pulmonary edema     Past Surgical History  Procedure Laterality Date  . Exercise stress cardiolite  01/17/2003  . Doppler echocardiography  08/27/2001  . Electrocardiogram  11/21/2005  . Appendectomy  unsure of date    History   Social History  . Marital Status: Legally Separated    Spouse Name: N/A    Number of Children: N/A  . Years of Education: 10th grade   Occupational History  .      Retired   Social History Main Topics  . Smoking status: Former Smoker    Types: Cigarettes  . Smokeless tobacco: Not on file  . Alcohol Use: No     Comment: Quit drinking-heavy drinker  . Drug Use: No  . Sexual Activity: Not on file   Other Topics Concern  . Not on file   Social History Narrative   Retired Financial controller    Current Outpatient Prescriptions on File Prior to Visit  Medication Sig Dispense Refill  . citalopram (CELEXA) 20 MG tablet Take 1 tablet (20 mg total) by mouth daily.  90 tablet  3  . ezetimibe (ZETIA) 10 MG tablet Take 1 tablet (10 mg total) by mouth daily.  90 tablet  3  . fluticasone (FLONASE) 50 MCG/ACT nasal spray Place 2 sprays into the nose daily.  16 g  6  . FLUVIRIN INJ injection       . losartan-hydrochlorothiazide (HYZAAR) 100-25 MG per tablet Take 1 tablet by mouth daily.  90 tablet  3  . sildenafil (REVATIO) 20 MG tablet 3-5 pills as needed for your  nature  20 tablet  11  . tamsulosin (FLOMAX) 0.4 MG CAPS capsule Take 1 capsule (0.4 mg total) by mouth daily.  90 capsule  3   No current facility-administered medications on file prior to visit.    Allergies  Allergen Reactions  . Enalapril Maleate   . Fluvastatin Sodium     REACTION: Myalgia  . Penicillins     Family History  Problem Relation Age of Onset  . Cancer Father     Prostate Cancer    BP 116/74  Pulse 78  Temp(Src) 98.6 F (37 C) (Oral)  Ht 5\' 10"  (1.778 m)  Wt 242 lb (109.77 kg)  BMI 34.72 kg/m2  SpO2 97%  Review of Systems Denies sob and chest pain.      Objective:   Physical Exam VITAL SIGNS:  See vs page GENERAL: no distress Neck: full rom, but full flexion is painful.  There is no palpable thyroid enlargement.  No thyroid nodule is palpable.  No palpable lymphadenopathy at the neck.  No tenderness.  LUNGS:  Clear to auscultation HEART:  Regular rate and rhythm without murmurs noted. Normal S1,S2.        Assessment & Plan:  HTN: well-controlled Acute bronchitis, new Neck pain: pt says this is due  to cough--i agree.

## 2013-06-07 NOTE — Patient Instructions (Addendum)
i have sent a prescriptions: antibiotic and pain/cough pill.   Please skip the citalopram while you are on the azithromycin, due to an interaction.   I hope you feel better soon.  If you don't feel better by next week, please call back.  Please call sooner if you get worse.

## 2013-10-01 ENCOUNTER — Ambulatory Visit (INDEPENDENT_AMBULATORY_CARE_PROVIDER_SITE_OTHER): Payer: Medicare Other | Admitting: Endocrinology

## 2013-10-01 ENCOUNTER — Encounter: Payer: Self-pay | Admitting: Endocrinology

## 2013-10-01 VITALS — BP 132/90 | HR 69 | Temp 98.1°F | Ht 70.0 in | Wt 239.0 lb

## 2013-10-01 DIAGNOSIS — R109 Unspecified abdominal pain: Secondary | ICD-10-CM

## 2013-10-01 DIAGNOSIS — D500 Iron deficiency anemia secondary to blood loss (chronic): Secondary | ICD-10-CM

## 2013-10-01 LAB — BASIC METABOLIC PANEL
BUN: 15 mg/dL (ref 6–23)
CHLORIDE: 103 meq/L (ref 96–112)
CO2: 26 mEq/L (ref 19–32)
Calcium: 9.9 mg/dL (ref 8.4–10.5)
Creatinine, Ser: 1.4 mg/dL (ref 0.4–1.5)
GFR: 62.35 mL/min (ref >60.00)
GLUCOSE: 113 mg/dL — AB (ref 70–99)
POTASSIUM: 4.3 meq/L (ref 3.5–5.1)
Sodium: 132 mEq/L — ABNORMAL LOW (ref 135–145)

## 2013-10-01 LAB — URINALYSIS, ROUTINE W REFLEX MICROSCOPIC
Bilirubin Urine: NEGATIVE
Ketones, ur: NEGATIVE
Leukocytes, UA: NEGATIVE
Nitrite: NEGATIVE
Specific Gravity, Urine: 1.025 (ref 1.000–1.030)
Total Protein, Urine: NEGATIVE
URINE GLUCOSE: NEGATIVE
UROBILINOGEN UA: 0.2 (ref 0.0–1.0)
pH: 6 (ref 5.0–8.0)

## 2013-10-01 LAB — CBC WITH DIFFERENTIAL/PLATELET
BASOS ABS: 0 10*3/uL (ref 0.0–0.1)
Basophils Relative: 0.6 % (ref 0.0–3.0)
EOS ABS: 0.2 10*3/uL (ref 0.0–0.7)
Eosinophils Relative: 4 % (ref 0.0–5.0)
HCT: 41.9 % (ref 39.0–52.0)
Hemoglobin: 13.6 g/dL (ref 13.0–17.0)
LYMPHS ABS: 1.6 10*3/uL (ref 0.7–4.0)
Lymphocytes Relative: 32.4 % (ref 12.0–46.0)
MCHC: 32.5 g/dL (ref 30.0–36.0)
MCV: 87.9 fl (ref 78.0–100.0)
MONO ABS: 0.7 10*3/uL (ref 0.1–1.0)
Monocytes Relative: 13.1 % — ABNORMAL HIGH (ref 3.0–12.0)
Neutro Abs: 2.5 10*3/uL (ref 1.4–7.7)
Neutrophils Relative %: 49.9 % (ref 43.0–77.0)
PLATELETS: 204 10*3/uL (ref 150.0–400.0)
RBC: 4.76 Mil/uL (ref 4.22–5.81)
RDW: 14.6 % (ref 11.5–15.5)
WBC: 5 10*3/uL (ref 4.0–10.5)

## 2013-10-01 LAB — IBC PANEL
IRON: 76 ug/dL (ref 42–165)
Saturation Ratios: 21 % (ref 20.0–50.0)
Transferrin: 258.5 mg/dL (ref 212.0–360.0)

## 2013-10-01 NOTE — Progress Notes (Signed)
Subjective:    Patient ID: Miguel Blake, male    DOB: 1932-09-03, 81 y.o.   MRN: [id number]  HPI Pt states few weeks of mild pain at the mid-abdomen (between umbilicus and suprapubic area), but no assoc fever.  He says pain is improved the past few days.  Past Medical History  Diagnosis Date  . DEMENTIA 09/15/2006  . HYPERTENSION 09/15/2006  . OSTEOARTHRITIS 09/15/2006  . BENIGN PROSTATIC HYPERTROPHY, WITH OBSTRUCTION 05/11/2008  . 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 HYPERCHOLESTEROLEMIA 05/11/2008  . Increased prostate specific antigen (PSA) velocity   . Non-cardiogenic pulmonary edema     Past Surgical History  Procedure Laterality Date  . Exercise stress cardiolite  01/17/2003  . Doppler echocardiography  08/27/2001  . Electrocardiogram  11/21/2005  . Appendectomy  unsure of date    History   Social History  . Marital Status: Legally Separated    Spouse Name: N/A    Number of Children: N/A  . Years of Education: 10th grade   Occupational History  .      Retired   Social History Main Topics  . Smoking status: Former Smoker    Types: Cigarettes  . Smokeless tobacco: Not on file  . Alcohol Use: No     Comment: Quit drinking-heavy drinker  . Drug Use: No  . Sexual Activity: Not on file   Other Topics Concern  . Not on file   Social History Narrative   Retired Financial controller    Current Outpatient Prescriptions on File Prior to Visit  Medication Sig Dispense Refill  . citalopram (CELEXA) 20 MG tablet Take 1 tablet (20 mg total) by mouth daily.  90 tablet  3  . ezetimibe (ZETIA) 10 MG tablet Take 1 tablet (10 mg total) by mouth daily.  90 tablet  3  . fluticasone (FLONASE) 50 MCG/ACT nasal spray Place 2 sprays into the nose daily.  16 g  6  . FLUVIRIN INJ injection       . losartan-hydrochlorothiazide (HYZAAR) 100-25 MG per tablet Take 1 tablet by mouth daily.  90 tablet  3  . sildenafil (REVATIO) 20 MG  tablet 3-5 pills as needed for your nature  20 tablet  11  . tamsulosin (FLOMAX) 0.4 MG CAPS capsule Take 1 capsule (0.4 mg total) by mouth daily.  90 capsule  3  . traMADol (ULTRAM) 50 MG tablet Take 1 tablet (50 mg total) by mouth every 6 (six) hours as needed (for cough or pain).  50 tablet  0   No current facility-administered medications on file prior to visit.    Allergies  Allergen Reactions  . Enalapril Maleate   . Fluvastatin Sodium     REACTION: Myalgia  . Penicillins     Family History  Problem Relation Age of Onset  . Cancer Father     Prostate Cancer    BP 132/90  Pulse 69  Temp(Src) 98.1 F (36.7 C) (Oral)  Ht 5\' 10"  (1.778 m)  Wt 239 lb (108.41 kg)  BMI 34.29 kg/m2  SpO2 97%    Review of Systems Denies diarrhea, brbpr, urinary hesitancy, and hematuria.      Objective:   Physical Exam VITAL SIGNS:  See vs page GENERAL: no distress ABDOMEN: abdomen is soft, nontender.  no hepatosplenomegaly. not distended.  no hernia  Lab Results  Component Value Date   WBC 5.0 10/01/2013   HGB 13.6 10/01/2013   HCT  41.9 10/01/2013   PLT 204.0 10/01/2013   GLUCOSE 113* 10/01/2013   CHOL 202* 11/17/2012   TRIG 83.0 11/17/2012   HDL 42.80 11/17/2012   LDLDIRECT 140.7 11/17/2012   LDLCALC 84 05/08/2009   ALT 19 11/17/2012   AST 16 11/17/2012   NA 132* 10/01/2013   K 4.3 10/01/2013   CL 103 10/01/2013   CREATININE 1.4 10/01/2013   BUN 15 10/01/2013   CO2 26 10/01/2013   TSH 1.43 11/17/2012   PSA 3.66 11/17/2012       Assessment & Plan:  abd pain, new, uncertain etiology HTN, worse.  He may have a situational component: we'll recheck upon return.   Hyponatremia, recurrent, uncertain etiology, we'll follow.    Patient is advised the following: Patient Instructions  Please come back for a regular physical appointment in 2 months.  blood and urine tests are being requested for you today.  We'll contact you with results.  Let's check an ultrasound.  you will receive a phone  call, about a day and time for an appointment.

## 2013-10-01 NOTE — Patient Instructions (Addendum)
Please come back for a regular physical appointment in 2 months.  blood and urine tests are being requested for you today.  We'll contact you with results.  Let's check an ultrasound.  you will receive a phone call, about a day and time for an appointment.

## 2013-10-15 ENCOUNTER — Encounter: Payer: Self-pay | Admitting: Endocrinology

## 2013-11-05 ENCOUNTER — Ambulatory Visit
Admission: RE | Admit: 2013-11-05 | Discharge: 2013-11-05 | Disposition: A | Discharge status: Home / Self Care | Payer: Commercial Managed Care - HMO | Source: Ambulatory Visit | Attending: Endocrinology | Admitting: Endocrinology

## 2013-11-05 ENCOUNTER — Other Ambulatory Visit: Payer: Self-pay | Admitting: Endocrinology

## 2013-11-05 DIAGNOSIS — N401 Enlarged prostate with lower urinary tract symptoms: Secondary | ICD-10-CM

## 2013-11-05 DIAGNOSIS — N138 Other obstructive and reflux uropathy: Secondary | ICD-10-CM

## 2013-11-05 DIAGNOSIS — R109 Unspecified abdominal pain: Secondary | ICD-10-CM

## 2013-11-05 DIAGNOSIS — D500 Iron deficiency anemia secondary to blood loss (chronic): Secondary | ICD-10-CM

## 2013-11-15 ENCOUNTER — Other Ambulatory Visit: Payer: Self-pay | Admitting: Endocrinology

## 2013-11-15 NOTE — Telephone Encounter (Signed)
Please advise if ok to refill. Rx is not on current medication list.  Thanks!  

## 2013-12-01 ENCOUNTER — Ambulatory Visit
Admission: RE | Admit: 2013-12-01 | Discharge: 2013-12-01 | Disposition: A | Discharge status: Home / Self Care | Payer: Commercial Managed Care - HMO | Source: Ambulatory Visit | Attending: Endocrinology | Admitting: Endocrinology

## 2013-12-01 ENCOUNTER — Encounter: Payer: Self-pay | Admitting: Endocrinology

## 2013-12-01 ENCOUNTER — Ambulatory Visit (INDEPENDENT_AMBULATORY_CARE_PROVIDER_SITE_OTHER): Payer: Commercial Managed Care - HMO | Admitting: Endocrinology

## 2013-12-01 VITALS — BP 130/80 | HR 75 | Temp 98.1°F | Ht 70.0 in | Wt 248.0 lb

## 2013-12-01 DIAGNOSIS — Z125 Encounter for screening for malignant neoplasm of prostate: Secondary | ICD-10-CM

## 2013-12-01 DIAGNOSIS — R062 Wheezing: Secondary | ICD-10-CM

## 2013-12-01 DIAGNOSIS — E7800 Pure hypercholesterolemia, unspecified: Secondary | ICD-10-CM

## 2013-12-01 DIAGNOSIS — Z0000 Encounter for general adult medical examination without abnormal findings: Secondary | ICD-10-CM

## 2013-12-01 DIAGNOSIS — Z23 Encounter for immunization: Secondary | ICD-10-CM

## 2013-12-01 DIAGNOSIS — E871 Hypo-osmolality and hyponatremia: Secondary | ICD-10-CM

## 2013-12-01 DIAGNOSIS — R739 Hyperglycemia, unspecified: Secondary | ICD-10-CM

## 2013-12-01 LAB — LIPID PANEL
Cholesterol: 222 mg/dL — ABNORMAL HIGH (ref 0–200)
HDL: 34.8 mg/dL — ABNORMAL LOW (ref >39.00)
LDL Cholesterol: 169 mg/dL — ABNORMAL HIGH (ref 0–99)
NonHDL: 187.2
Total CHOL/HDL Ratio: 6
Triglycerides: 93 mg/dL (ref 0.0–149.0)
VLDL: 18.6 mg/dL (ref 0.0–40.0)

## 2013-12-01 LAB — HEPATIC FUNCTION PANEL
ALT: 18 U/L (ref 0–53)
AST: 19 U/L (ref 0–37)
Albumin: 3.1 g/dL — ABNORMAL LOW (ref 3.5–5.2)
Alkaline Phosphatase: 57 U/L (ref 39–117)
Bilirubin, Direct: 0.1 mg/dL (ref 0.0–0.3)
TOTAL PROTEIN: 7.9 g/dL (ref 6.0–8.3)
Total Bilirubin: 0.5 mg/dL (ref 0.2–1.2)

## 2013-12-01 LAB — BASIC METABOLIC PANEL
BUN: 17 mg/dL (ref 6–23)
CO2: 26 meq/L (ref 19–32)
Calcium: 9.7 mg/dL (ref 8.4–10.5)
Chloride: 103 mEq/L (ref 96–112)
Creatinine, Ser: 1.3 mg/dL (ref 0.4–1.5)
GFR: 68.45 mL/min (ref >60.00)
GLUCOSE: 116 mg/dL — AB (ref 70–99)
POTASSIUM: 4.3 meq/L (ref 3.5–5.1)
SODIUM: 136 meq/L (ref 135–145)

## 2013-12-01 LAB — PSA: PSA: 11.82 ng/mL — AB (ref 0.10–4.00)

## 2013-12-01 LAB — TSH: TSH: 1.77 u[IU]/mL (ref 0.35–4.50)

## 2013-12-01 MED ORDER — FLUTICASONE-SALMETEROL 100-50 MCG/DOSE IN AEPB: 1.0000 | INHALATION_SPRAY | Freq: Two times a day (BID) | RESPIRATORY_TRACT | Status: DC

## 2013-12-01 MED ORDER — SILDENAFIL CITRATE 100 MG PO TABS: 50.0000 mg | ORAL_TABLET | Freq: Every day | ORAL | Status: DC | PRN

## 2013-12-01 NOTE — Progress Notes (Signed)
Subjective:    Patient ID: Miguel Blake, male    DOB: 1932/09/16, 80 y.o.   MRN: [id number]  HPI Pt is here for regular wellness examination, and is feeling pretty well in general, and says chronic med probs are stable, except as noted below Past Medical History  Diagnosis Date  . DEMENTIA 09/15/2006  . HYPERTENSION 09/15/2006  . OSTEOARTHRITIS 09/15/2006  . BENIGN PROSTATIC HYPERTROPHY, WITH OBSTRUCTION 05/11/2008  . 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 HYPERCHOLESTEROLEMIA 05/11/2008  . Increased prostate specific antigen (PSA) velocity   . Non-cardiogenic pulmonary edema     Past Surgical History  Procedure Laterality Date  . Exercise stress cardiolite  01/17/2003  . Doppler echocardiography  08/27/2001  . Electrocardiogram  11/21/2005  . Appendectomy  unsure of date    History   Social History  . Marital Status: Legally Separated    Spouse Name: N/A    Number of Children: N/A  . Years of Education: 10th grade   Occupational History  .      Retired   Social History Main Topics  . Smoking status: Former Smoker    Types: Cigarettes  . Smokeless tobacco: Not on file  . Alcohol Use: No     Comment: Quit drinking-heavy drinker  . Drug Use: No  . Sexual Activity: Not on file   Other Topics Concern  . Not on file   Social History Narrative   Retired Financial controller    Current Outpatient Prescriptions on File Prior to Visit  Medication Sig Dispense Refill  . citalopram (CELEXA) 20 MG tablet Take 1 tablet (20 mg total) by mouth daily.  90 tablet  3  . ezetimibe (ZETIA) 10 MG tablet Take 1 tablet (10 mg total) by mouth daily.  90 tablet  3  . finasteride (PROSCAR) 5 MG tablet TAKE 1 TABLET BY MOUTH DAILY  90 tablet  0  . fluticasone (FLONASE) 50 MCG/ACT nasal spray Place 2 sprays into the nose daily.  16 g  6  . FLUVIRIN INJ injection       . losartan-hydrochlorothiazide (HYZAAR) 100-25 MG per tablet Take 1  tablet by mouth daily.  90 tablet  3  . sildenafil (REVATIO) 20 MG tablet 3-5 pills as needed for your nature  20 tablet  11  . tamsulosin (FLOMAX) 0.4 MG CAPS capsule Take 1 capsule (0.4 mg total) by mouth daily.  90 capsule  3  . traMADol (ULTRAM) 50 MG tablet Take 1 tablet (50 mg total) by mouth every 6 (six) hours as needed (for cough or pain).  50 tablet  0   No current facility-administered medications on file prior to visit.    Allergies  Allergen Reactions  . Enalapril Maleate   . Fluvastatin Sodium     REACTION: Myalgia  . Penicillins     Family History  Problem Relation Age of Onset  . Cancer Father     Prostate Cancer    BP 130/80  Pulse 75  Temp(Src) 98.1 F (36.7 C) (Oral)  Ht 5\' 10"  (1.778 m)  Wt 248 lb (112.492 kg)  BMI 35.58 kg/m2  SpO2 94%  Review of Systems  Constitutional: Negative for fever.  HENT: Negative for hearing loss.   Eyes: Negative for visual disturbance.  Respiratory: Positive for wheezing. Negative for cough.   Cardiovascular: Negative for chest pain.  Gastrointestinal: Negative for anal bleeding.  Endocrine: Negative for cold intolerance.  Genitourinary: Negative  for hematuria.  Musculoskeletal: Positive for back pain.  Skin: Negative for rash.  Allergic/Immunologic: Positive for environmental allergies.  Neurological: Negative for syncope, numbness and headaches.  Hematological: Does not bruise/bleed easily.  Psychiatric/Behavioral: Negative for dysphoric mood.       Objective:   Physical Exam VS: see vs page GEN: no distress HEAD: head: no deformity eyes: no periorbital swelling, no proptosis external nose and ears are normal mouth: no lesion seen NECK: supple, thyroid is not enlarged CHEST WALL: no deformity BREASTS:  No gynecomastia CV: reg rate and rhythm, no murmur ABD: abdomen is soft, nontender.  no hepatosplenomegaly.  not distended.  no hernia GENITALIA/RECTAL/PROSTATE: sees urology MUSCULOSKELETAL: muscle bulk  and strength are grossly normal.  no obvious joint swelling.  gait is normal and steady EXTEMITIES: no deformity.  no ulcer on the feet.  feet are of normal color and temp.  1+ bilat leg edema.  There is bilateral onychomycosis PULSES: dorsalis pedis intact bilat.  no carotid bruit NEURO:  cn 2-12 grossly intact.   readily moves all 4's.  sensation is intact to touch on the feet SKIN:  Normal texture and temperature.  No rash or suspicious lesion is visible.   NODES:  None palpable at the neck PSYCH: alert, well-oriented.  Does not appear anxious nor depressed.       Assessment & Plan:  Pt is here for regular wellness examination, and is feeling pretty well in general, and says chronic med probs are stable, except as noted below we discussed code status.  pt requests full code, but would not want to be started or maintained on artificial life-support measures if there was not a reasonable chance of recovery.   SEPARATE EVALUATION FOLLOWS--EACH PROBLEM HERE IS NEW, NOT RESPONDING TO TREATMENT, OR POSES SIGNIFICANT RISK TO THE PATIENT'S HEALTH: HISTORY OF THE PRESENT ILLNESS: Pt states few mos of slight doe sensation in the chest, and assoc wheezing.  PAST MEDICAL HISTORY reviewed and up to date today REVIEW OF SYSTEMS: abd pain is resolved.  He has weight gain PHYSICAL EXAMINATION: VITAL SIGNS:  See vs page GENERAL: no distress LUNGS:  Clear to auscultation, except for a few wheezes. LAB/XRAY RESULTS: i reviewed abd [place] results. CXR: scarring i reviewed spirometry results. IMPRESSION: pulm scarring, new, uncertain etiology abd pain, resolved PLAN:ref pulm

## 2013-12-01 NOTE — Patient Instructions (Addendum)
please consider these measures for your health:  minimize alcohol.  do not use tobacco products.  have a colonoscopy at least every 10 years from age 80.   keep firearms safely stored.  always use seat belts.  have working smoke alarms in your home.  see an eye doctor and dentist regularly.  never drive under the influence of alcohol or drugs (including prescription drugs).   it is critically important to prevent falling down (keep floor areas well-lit, dry, and free of loose objects.  If you have a cane, walker, or wheelchair, you should use it, even for short trips around the house.  Also, try not to rush). i have sent a prescription to your pharmacy, for an inhaler.  Weight loss helps the breathing, too. blood tests, and a chest-x-ray, are requested for you today.  We'll contact you with results. Please come back for a follow-up appointment in 6 months.

## 2013-12-02 ENCOUNTER — Encounter: Payer: Self-pay | Admitting: Internal Medicine

## 2013-12-07 ENCOUNTER — Ambulatory Visit (INDEPENDENT_AMBULATORY_CARE_PROVIDER_SITE_OTHER): Payer: Commercial Managed Care - HMO | Admitting: Internal Medicine

## 2013-12-07 ENCOUNTER — Encounter: Payer: Self-pay | Admitting: Internal Medicine

## 2013-12-07 VITALS — BP 150/84 | HR 78 | Temp 98.6°F | Ht 68.0 in | Wt 249.0 lb

## 2013-12-07 DIAGNOSIS — E871 Hypo-osmolality and hyponatremia: Secondary | ICD-10-CM

## 2013-12-07 DIAGNOSIS — R0600 Dyspnea, unspecified: Secondary | ICD-10-CM

## 2013-12-07 MED ORDER — FLUTICASONE-SALMETEROL 100-50 MCG/DOSE IN AEPB: 1.0000 | INHALATION_SPRAY | Freq: Two times a day (BID) | RESPIRATORY_TRACT | Status: DC

## 2013-12-07 MED ORDER — FAMOTIDINE 20 MG PO TABS: ORAL_TABLET | ORAL | Status: DC

## 2013-12-07 MED ORDER — OMEPRAZOLE 20 MG PO CPDR: 20.0000 mg | DELAYED_RELEASE_CAPSULE | Freq: Every day | ORAL | Status: DC

## 2013-12-07 NOTE — Patient Instructions (Addendum)
Try prilosec 20mg   Take 30-60 min before first meal of the day and Pepcid 20 mg one bedtime until cough is completely gone for at least a week without the need for cough suppression  GERD (REFLUX)  is an extremely common cause of respiratory symptoms, many times with no significant heartburn at all.    It can be treated with medication, but also with lifestyle changes including avoidance of late meals, excessive alcohol, smoking cessation, and avoid fatty foods, chocolate, peppermint, colas, red wine, and acidic juices such as orange juice.  NO MINT OR MENTHOL PRODUCTS SO NO COUGH DROPS  USE SUGARLESS CANDY INSTEAD (jolley ranchers or Stover's)  NO OIL BASED VITAMINS - use powdered substitutes.  Try advair to see what difference it makes.   Please schedule a follow up office visit in 4 weeks, sooner if needed with pfts

## 2013-12-07 NOTE — Progress Notes (Signed)
   Subjective:    Patient ID: Miguel Blake, male    DOB: 04-04-1932  MRN: [id number]  HPI  70 yobm quit smoking around 1975 no sequelae around 170-180 with indolent onset of worse doe x 1995 and gradually worse so referred to pulmonary clinic 12/07/2013 by Dr Loanne Drilling   12/07/2013 1st [facility] office visit/ W[facility]   Chief Complaint  Patient presents with  . Pulmonary Consult    Referred by Dr Loanne Drilling. Pt c/o DOE with walking up stairs and short distances for "a good while now".  He also c/o wheezing and occ non prod cough.    indolent onset progressive doe HC parking x one year, can't do grocery shopping s riding on machine Some am cough / congestion no real excess or purulent sputum Given advair but hasn't started  No obvious other patterns in day to day or daytime variabilty or assoc chronic cough or cp or chest tightness, subjective wheeze overt sinus or hb symptoms. No unusual exp hx or h/o childhood pna/ asthma or knowledge of premature birth.  Sleeping ok without nocturnal  or early am exacerbation  of respiratory  c/o's or need for noct saba. Also denies any obvious fluctuation of symptoms with weather or environmental changes or other aggravating or alleviating factors except as outlined above   Current Medications, Allergies, Complete Past Medical History, Past Surgical History, Family History, and Social History were reviewed in [employer] record.             Review of Systems  Constitutional: Negative for fever, chills, activity change, appetite change and unexpected weight change.  HENT: Negative for congestion, dental problem, postnasal drip, rhinorrhea, sneezing, sore throat, trouble swallowing and voice change.   Eyes: Negative for visual disturbance.  Respiratory: Positive for cough and shortness of breath. Negative for choking.   Cardiovascular: Negative for chest pain and leg swelling.  Gastrointestinal: Negative for nausea, vomiting  and abdominal pain.  Genitourinary: Negative for difficulty urinating.  Musculoskeletal: Negative for arthralgias.  Skin: Negative for rash.  Psychiatric/Behavioral: Negative for behavioral problems and confusion.       Objective:   Physical Exam  Pleasant amb mod hoarse bm nad   Wt Readings from Last 3 Encounters:  12/07/13 249 lb (112.946 kg)  12/01/13 248 lb (112.492 kg)  10/01/13 239 lb (108.41 kg)     HEENT: nl dentition, turbinates, and orophanx. Nl external ear canals without cough reflex   NECK :  without JVD/Nodes/TM/ nl carotid upstrokes bilaterally   LUNGS: no acc muscle use,  Distant bs, no wheeze   CV:  RRR  no s3 or murmur or increase in P2, no edema   ABD:  Obese/ soft and nontender with nl excursion in the supine position. No bruits or organomegaly, bowel sounds nl  MS:  warm without deformities, calf tenderness, cyanosis or clubbing  SKIN: warm and dry without lesions    NEURO:  alert, approp, no deficits      cxr 12/01/13 1. Airway thickening is present, suggesting bronchitis or reactive  airways disease. There is also mild chronic interstitial  accentuation.  2. Tortuous thoracic aorta.  3. Chronic hilar prominence thought to be vascular.  4. Chronic biapical pleural parenchymal scarring.        Assessment & Plan:

## 2013-12-07 NOTE — Assessment & Plan Note (Addendum)
-   Spirometry 12/01/2013 restrictive though FV concave > rec try advair  - 12/07/2013  Walked RA x 3 laps @ 185 ft each stopped due to  Legs hurt end of study mod pace/ no desats  Symptoms are markedly disproportionate to objective findings and not clear this is a lung problem but pt does appear to have difficult airway management issues. DDX of  difficult airways management all start with A and  include Adherence, Ace Inhibitors, Acid Reflux, Active Sinus Disease, Alpha 1 Antitripsin deficiency, Anxiety masquerading as Airways dz,  ABPA,  allergy(esp in young), Aspiration (esp in elderly), Adverse effects of DPI,  Active smokers, plus two Bs  = Bronchiectasis and Beta blocker use..and one C= CHF  ? Acid (or non-acid) GERD > always difficult to exclude as up to 75% of pts in some series report no assoc GI/ Heartburn symptoms> rec max (24h)  acid suppression and diet restrictions/ reviewed and instructions given in writing.   ? Allergy/ asthma > doubt but ok to try advair since he already has it  ? chf > no evidence for this but need to keep in DDX.  Does not appear to have sign copd at this point but will see back for full pfts to sort out restrictive changes that are probably largely related to obesity

## 2013-12-12 ENCOUNTER — Other Ambulatory Visit: Payer: Self-pay | Admitting: Endocrinology

## 2013-12-13 ENCOUNTER — Telehealth: Payer: Self-pay | Admitting: Endocrinology

## 2013-12-13 ENCOUNTER — Other Ambulatory Visit: Payer: Self-pay | Admitting: *Deleted

## 2013-12-13 MED ORDER — CITALOPRAM HYDROBROMIDE 20 MG PO TABS: 20.0000 mg | ORAL_TABLET | Freq: Every day | ORAL | Status: DC

## 2013-12-13 NOTE — Telephone Encounter (Signed)
See below and please advise, Thanks!  

## 2013-12-13 NOTE — Telephone Encounter (Signed)
i need a dx for this

## 2013-12-13 NOTE — Telephone Encounter (Signed)
Pt needs eye referral to [facility] [address] in [place] Dr. Mariea Stable

## 2013-12-14 NOTE — Telephone Encounter (Signed)
Lvom advising pt to call back to discuss reason for eye Dr appointment.

## 2013-12-30 ENCOUNTER — Telehealth: Payer: Self-pay

## 2013-12-30 MED ORDER — EZETIMIBE 10 MG PO TABS: 10.0000 mg | ORAL_TABLET | Freq: Every day | ORAL | Status: DC

## 2013-12-30 MED ORDER — CITALOPRAM HYDROBROMIDE 20 MG PO TABS: 20.0000 mg | ORAL_TABLET | Freq: Every day | ORAL | Status: DC

## 2013-12-30 MED ORDER — FINASTERIDE 5 MG PO TABS: ORAL_TABLET | ORAL | Status: DC

## 2013-12-30 MED ORDER — FLUTICASONE-SALMETEROL 100-50 MCG/DOSE IN AEPB: 1.0000 | INHALATION_SPRAY | Freq: Two times a day (BID) | RESPIRATORY_TRACT | Status: DC

## 2013-12-30 MED ORDER — TAMSULOSIN HCL 0.4 MG PO CAPS: 0.4000 mg | ORAL_CAPSULE | Freq: Every day | ORAL | Status: DC

## 2013-12-30 MED ORDER — LOSARTAN POTASSIUM-HCTZ 100-25 MG PO TABS: 1.0000 | ORAL_TABLET | Freq: Every day | ORAL | Status: DC

## 2013-12-30 NOTE — Telephone Encounter (Signed)
Received a refill request for Celexa 20mg . Med was last refilled on 12/13/2013 but it was sent to pt's local pharmacy. Pt is now requesting that medication be sent to his mail order pharmacy. Please advise, Thanks!

## 2013-12-30 NOTE — Telephone Encounter (Signed)
Refill sent to pharmacy.   

## 2013-12-30 NOTE — Telephone Encounter (Signed)
ok 

## 2013-12-31 ENCOUNTER — Telehealth: Payer: Self-pay | Admitting: Endocrinology

## 2013-12-31 DIAGNOSIS — H269 Unspecified cataract: Secondary | ICD-10-CM | POA: Insufficient documentation

## 2013-12-31 NOTE — Telephone Encounter (Signed)
See below, I Contacted pt and the reason for referral is cataracts.

## 2013-12-31 NOTE — Telephone Encounter (Signed)
Patient would like to be referred to [facility]    Please advise patient    Thank you

## 2013-12-31 NOTE — Telephone Encounter (Signed)
done

## 2014-01-04 ENCOUNTER — Other Ambulatory Visit: Payer: Self-pay | Admitting: Internal Medicine

## 2014-01-04 DIAGNOSIS — R0600 Dyspnea, unspecified: Secondary | ICD-10-CM

## 2014-01-05 ENCOUNTER — Ambulatory Visit (INDEPENDENT_AMBULATORY_CARE_PROVIDER_SITE_OTHER): Payer: Commercial Managed Care - HMO | Admitting: Internal Medicine

## 2014-01-05 ENCOUNTER — Encounter: Payer: Self-pay | Admitting: Internal Medicine

## 2014-01-05 VITALS — BP 120/80 | HR 72 | Ht 68.5 in | Wt 241.0 lb

## 2014-01-05 DIAGNOSIS — R0600 Dyspnea, unspecified: Secondary | ICD-10-CM

## 2014-01-05 LAB — PULMONARY FUNCTION TEST
DL/VA % pred: 82 %
DL/VA: 3.72 ml/min/mmHg/L
DLCO unc % pred: 54 %
DLCO unc: 16.52 ml/min/mmHg
FEF 25-75 Post: 0.85 L/sec
FEF 25-75 Pre: 1.44 L/sec
FEF2575-%Change-Post: -40 %
FEF2575-%Pred-Post: 43 %
FEF2575-%Pred-Pre: 73 %
FEV1-%Change-Post: -9 %
FEV1-%PRED-POST: 72 %
FEV1-%Pred-Pre: 79 %
FEV1-POST: 1.8 L
FEV1-Pre: 1.98 L
FEV1FVC-%CHANGE-POST: 3 %
FEV1FVC-%Pred-Pre: 99 %
FEV6-%Change-Post: -11 %
FEV6-%Pred-Post: 71 %
FEV6-%Pred-Pre: 80 %
FEV6-POST: 2.32 L
FEV6-PRE: 2.62 L
FEV6FVC-%CHANGE-POST: 0 %
FEV6FVC-%PRED-POST: 106 %
FEV6FVC-%PRED-PRE: 105 %
FVC-%Change-Post: -12 %
FVC-%Pred-Post: 67 %
FVC-%Pred-Pre: 76 %
FVC-Post: 2.32 L
FVC-Pre: 2.65 L
POST FEV6/FVC RATIO: 100 %
Post FEV1/FVC ratio: 77 %
Pre FEV1/FVC ratio: 75 %
Pre FEV6/FVC Ratio: 99 %
RV % PRED: 64 %
RV: 1.65 L
TLC % pred: 65 %
TLC: 4.43 L

## 2014-01-05 NOTE — Assessment & Plan Note (Signed)
-   Spirometry 12/01/2013 restrictive though FV concave > rec try advair  - 12/07/2013  Walked RA x 3 laps @ 185 ft each stopped due to  Legs hurt end of study mod pace/ no desats - 01/05/2014 pfts min restrictive changes s obst/ off advair x 4 days > ok to leave off   I had an extended summary discussion with the patient today lasting 15 to 20 minutes of a 25 minute visit on the following issues:   Clearly better and now able to do WM walking vs riding scooter at [facility] previously ? Related to use of gerd rx  Biggest problem is 60 lb of wt gain since stopped smoking > cal bal reviewed  No need for advair or pulmonary f/u at this point

## 2014-01-05 NOTE — Progress Notes (Signed)
PFT done today. 

## 2014-01-05 NOTE — Progress Notes (Signed)
Subjective:    Patient ID: Miguel Blake, male    DOB: [birth year]  MRN: [id number]    Brief patient profile:  81 yobm quit smoking around 1975 no sequelae around 170-180 with indolent onset of worse doe x 1995 and gradually worse so referred to pulmonary clinic 12/07/2013 by Dr Miguel Blake with nl pfts 01/05/2014 off all resp rx     History of Present Illness  12/07/2013 1st [facility] office visit/ Miguel Blake   Chief Complaint  Patient presents with  . Pulmonary Consult    Referred by Dr Miguel Blake. Pt c/o DOE with walking up stairs and short distances for "a good while now".  He also c/o wheezing and occ non prod cough.    indolent onset progressive doe HC parking x one year, can't do grocery shopping s riding on machine Some am cough / congestion no real excess or purulent sputum Given advair but hasn't started rec Try prilosec 20mg   Take 30-60 min before first meal of the day and Pepcid 20 mg one bedtime until cough is completely gone for at least a week without the need for cough suppression GERD Try advair to see what difference it makes > no change so d/c'd 01/01/14       01/05/2014 f/u ov/Miguel Blake Quant re: unexplained sob  Chief Complaint  Patient presents with  . Follow-up    PFT done today. Cough has improved. No new co's today.      Better to point where only sob doe x one flight and walk x 5 m slt slower than others / now walking at [facility] where was riding scooter at [facility]  No obvious day to day or daytime variabilty or assoc chronic cough or cp or chest tightness, subjective wheeze overt sinus or hb symptoms. No unusual exp hx or h/o childhood pna/ asthma or knowledge of premature birth.  Sleeping ok without nocturnal  or early am exacerbation  of respiratory  c/o's or need for noct saba. Also denies any obvious fluctuation of symptoms with weather or environmental changes or other aggravating or alleviating factors except as outlined above   Current Medications, Allergies, Complete  Past Medical History, Past Surgical History, Family History, and Social History were reviewed in [employer] record.  ROS  The following are not active complaints unless bolded sore throat, dysphagia, dental problems, itching, sneezing,  nasal congestion or excess/ purulent secretions, ear ache,   fever, chills, sweats, unintended wt loss, pleuritic or exertional cp, hemoptysis,  orthopnea pnd or leg swelling, presyncope, palpitations, heartburn, abdominal pain, anorexia, nausea, vomiting, diarrhea  or change in bowel or urinary habits, change in stools or urine, dysuria,hematuria,  rash, arthralgias, visual complaints, headache, numbness weakness or ataxia or problems with walking or coordination,  change in mood/affect or memory.                   Objective:   Physical Exam  Pleasant amb bm nad   01/05/2014      241   Wt Readings from Last 3 Encounters:  12/07/13 249 lb (112.946 kg)  12/01/13 248 lb (112.492 kg)  10/01/13 239 lb (108.41 kg)     HEENT: nl dentition, turbinates, and orophanx. Nl external ear canals without cough reflex   NECK :  without JVD/Nodes/TM/ nl carotid upstrokes bilaterally   LUNGS: no acc muscle use,  Distant bs, no wheeze   CV:  RRR  no s3 or murmur or increase in P2, no edema  ABD:  Obese/ soft and nontender with nl excursion in the supine position. No bruits or organomegaly, bowel sounds nl  MS:  warm without deformities, calf tenderness, cyanosis or clubbing  SKIN: warm and dry without lesions    NEURO:  alert, approp, no deficits      cxr 12/01/13 1. Airway thickening is present, suggesting bronchitis or reactive  airways disease. There is also mild chronic interstitial  accentuation.  2. Tortuous thoracic aorta.  3. Chronic hilar prominence thought to be vascular.  4. Chronic biapical pleural parenchymal scarring.        Assessment & Plan:

## 2014-01-05 NOTE — Patient Instructions (Signed)
Ok to try off acid suppression after Dec 1 to see what in difference if any it makes and restart if cough or breathing worse  Weight control is simply a matter of calorie balance which needs to be tilted in your favor by eating less and exercising more.  To get the most out of exercise, you need to be continuously aware that you are short of breath, but never out of breath, for 30 minutes daily. As you improve, it will actually be easier for you to do the same amount of exercise  in  30 minutes so always push to the level where you are short of breath.      If you are satisfied with your treatment plan,  let your doctor know and he/she can either refill your medications or you can return here when your prescription runs out.     If in any way you are not 100% satisfied,  please tell us.  If 100% better, tell your friends!  Pulmonary follow up is as needed

## 2014-01-19 ENCOUNTER — Other Ambulatory Visit: Payer: Self-pay

## 2014-01-19 MED ORDER — EZETIMIBE 10 MG PO TABS: 10.0000 mg | ORAL_TABLET | Freq: Every day | ORAL | Status: DC

## 2014-02-01 ENCOUNTER — Encounter: Payer: Commercial Managed Care - HMO | Admitting: Internal Medicine

## 2014-05-23 ENCOUNTER — Encounter: Payer: Self-pay | Admitting: Endocrinology

## 2014-05-23 ENCOUNTER — Ambulatory Visit (INDEPENDENT_AMBULATORY_CARE_PROVIDER_SITE_OTHER): Payer: Commercial Managed Care - HMO | Admitting: Endocrinology

## 2014-05-23 VITALS — BP 138/88 | HR 85 | Temp 98.5°F | Ht 68.5 in | Wt 252.0 lb

## 2014-05-23 DIAGNOSIS — Z0001 Encounter for general adult medical examination with abnormal findings: Secondary | ICD-10-CM

## 2014-05-23 DIAGNOSIS — R109 Unspecified abdominal pain: Secondary | ICD-10-CM

## 2014-05-23 MED ORDER — TRAMADOL HCL 50 MG PO TABS: 50.0000 mg | ORAL_TABLET | Freq: Four times a day (QID) | ORAL | Status: DC | PRN

## 2014-05-23 NOTE — Progress Notes (Signed)
Subjective:    Patient ID: Miguel Blake, male    DOB: 1932-06-21, 81 y.o.   MRN: [id number]  HPI Pt states few weeks of intermittent moderate pain at the LUQ of the abdomen.  No assoc BRBPR.  He is unable to cite precip factor.   Past Medical History  Diagnosis Date  . DEMENTIA 09/15/2006  . HYPERTENSION 09/15/2006  . OSTEOARTHRITIS 09/15/2006  . BENIGN PROSTATIC HYPERTROPHY, WITH OBSTRUCTION 05/11/2008  . 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 HYPERCHOLESTEROLEMIA 05/11/2008  . Increased prostate specific antigen (PSA) velocity   . Non-cardiogenic pulmonary edema     Past Surgical History  Procedure Laterality Date  . Exercise stress cardiolite  01/17/2003  . Doppler echocardiography  08/27/2001  . Electrocardiogram  11/21/2005  . Appendectomy  unsure of date    History   Social History  . Marital Status: Legally Separated    Spouse Name: N/A  . Number of Children: N/A  . Years of Education: 10th grade   Occupational History  . 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     Social History Main Topics  . Smoking status: Former Smoker -- 1.00 packs/day for 20 years    Types: Cigarettes    Quit date: 02/18/1973  . Smokeless tobacco: Never Used  . Alcohol Use: No     Comment: Quit drinking-heavy drinker  . Drug Use: No  . Sexual Activity: Not on file   Other Topics Concern  . Not on file   Social History Narrative   Retired Financial controller    Current Outpatient Prescriptions on File Prior to Visit  Medication Sig Dispense Refill  . citalopram (CELEXA) 20 MG tablet Take 1 tablet (20 mg total) by mouth daily. 90 tablet 3  . ezetimibe (ZETIA) 10 MG tablet Take 1 tablet (10 mg total) by mouth daily. 90 tablet 0  . famotidine (PEPCID) 20 MG tablet One at bedtime 30 tablet 11  . finasteride (PROSCAR) 5 MG tablet TAKE 1 TABLET BY MOUTH DAILY 90 tablet 3  . losartan-hydrochlorothiazide (HYZAAR) 100-25 MG per tablet Take  1 tablet by mouth daily. 90 tablet 3  . omeprazole (PRILOSEC) 20 MG capsule Take 1 capsule (20 mg total) by mouth daily. 30 capsule 11  . tamsulosin (FLOMAX) 0.4 MG CAPS capsule Take 1 capsule (0.4 mg total) by mouth daily. 90 capsule 3   No current facility-administered medications on file prior to visit.    Allergies  Allergen Reactions  . Enalapril Maleate   . Fluvastatin Sodium     REACTION: Myalgia  . Penicillins     Family History  Problem Relation Age of Onset  . Cancer Father     Prostate Cancer    BP 138/88 mmHg  Pulse 85  Temp(Src) 98.5 F (36.9 C) (Oral)  Ht 5' 8.5" (1.74 m)  Wt 252 lb (114.306 kg)  BMI 37.75 kg/m2  SpO2 95%  Review of Systems Denies diarrhea, weight change, and hematuria.  Dry cough persists.     Objective:   Physical Exam VITAL SIGNS:  See vs page GENERAL: no distress LUNGS:  Clear to auscultation ABDOMEN: abdomen is soft, nontender.  no hepatosplenomegaly.  not distended.  no hernia.    i reviewed 2014 CT and 2015 [place] reports: no cause of pain is found.      Assessment & Plan:  Chronic abd pain, persistent, uncertain etiology Chronic cough, not improved.  Please see a  specialist.  you will receive a phone call, about a day and time for an appointment. Please try stopping the meloxicam, on a trial basis.  i have sent a prescription to your pharmacy, for the cough.    Subjective:   Patient here for Medicare annual wellness visit and management of other chronic and acute problems.     Risk factors: advanced age    36 of Physicians Providing Medical Care to Patient:  See "snapshot"   Activities of Daily Living: In your present state of health, do you have any difficulty performing the following activities (lives with a friend)?:  Preparing food and eating?: No  Bathing yourself: No  Getting dressed: No  Using the toilet:No  Moving around from place to place: No  In the past year have you fallen or had a near fall?:No      Home Safety: Has smoke detector and wears seat belts. Firearms are safely stored.  Diet and Exercise  Current exercise habits: pt says good Dietary issues discussed: pt reports a healthy diet   Depression Screen  Q1: Over the past two weeks, have you felt down, depressed or hopeless?no  Q2: Over the past two weeks, have you felt little interest or pleasure in doing things? no   The following portions of the patient's history were reviewed and updated as appropriate: allergies, current medications, past family history, past medical history, past social history, past surgical history and problem list.   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  remembers 3/3 at 5 minutes.  excellent recall.  cannot read or write a sentence (illiterate).  alert and oriented x 3.   Assessment:   Medicare wellness utd on preventive parameters    Plan:   During the course of the visit the patient was educated and counseled about appropriate screening and preventive services including:       Fall prevention     Diabetes screening  Nutrition counseling   Vaccines / LABS Zostavax / Pneumococcal Vaccine  today  PSA  Patient Instructions (the written plan) was given to the patient.

## 2014-05-23 NOTE — Patient Instructions (Addendum)
Please see a specialist.  you will receive a phone call, about a day and time for an appointment. Please try stopping the meloxicam, on a trial basis.  Please come back for a follow-up appointment in 7 months (must be after 12/02/14) i have sent a prescription to your pharmacy, for the cough.

## 2014-05-31 ENCOUNTER — Encounter: Payer: Self-pay | Admitting: Internal Medicine

## 2014-06-02 ENCOUNTER — Telehealth: Payer: Self-pay | Admitting: Endocrinology

## 2014-06-02 DIAGNOSIS — N400 Enlarged prostate without lower urinary tract symptoms: Secondary | ICD-10-CM

## 2014-06-02 NOTE — Telephone Encounter (Signed)
Left voicemail advising pt we needed to know the reason he is seeing the urologist.

## 2014-06-02 NOTE — Telephone Encounter (Signed)
Patient need a referral to see the urologist they made his appt tomorrow at 11:45, please advise

## 2014-06-03 NOTE — Telephone Encounter (Signed)
See notes below. I called pt back. He states he is following up with his Urologist today due to the prostate cancer he had in the past.

## 2014-06-03 NOTE — Telephone Encounter (Signed)
done

## 2014-06-03 NOTE — Telephone Encounter (Signed)
Pt notified referral has been placed.  

## 2014-07-26 ENCOUNTER — Telehealth: Payer: Self-pay | Admitting: Endocrinology

## 2014-07-26 NOTE — Telephone Encounter (Signed)
Patient called wanting verify his medications. I reviewed the pt's medication list and he voiced understanding on the prescriptions Dr. Loanne Drilling prescribed. Pt is going to contact Dr. Gustavus Bryant to discuss the medication he prescribes.

## 2014-07-26 NOTE — Telephone Encounter (Signed)
Pt has questions about medicines he is on. His insurance has messed up his meds and needs clarification

## 2014-07-27 ENCOUNTER — Telehealth: Payer: Self-pay | Admitting: Internal Medicine

## 2014-07-27 NOTE — Telephone Encounter (Signed)
Spoke with pt. The medications he is referring to are Omeprazole and Pepcid. His questions have been answered. Nothing further was needed.

## 2014-08-01 ENCOUNTER — Other Ambulatory Visit (INDEPENDENT_AMBULATORY_CARE_PROVIDER_SITE_OTHER): Payer: Commercial Managed Care - HMO

## 2014-08-01 ENCOUNTER — Telehealth: Payer: Self-pay | Admitting: Endocrinology

## 2014-08-01 ENCOUNTER — Ambulatory Visit (INDEPENDENT_AMBULATORY_CARE_PROVIDER_SITE_OTHER): Payer: Commercial Managed Care - HMO | Admitting: Internal Medicine

## 2014-08-01 ENCOUNTER — Encounter: Payer: Self-pay | Admitting: Internal Medicine

## 2014-08-01 VITALS — BP 152/84 | HR 68 | Ht 70.0 in | Wt 250.2 lb

## 2014-08-01 DIAGNOSIS — I700 Atherosclerosis of aorta: Secondary | ICD-10-CM | POA: Insufficient documentation

## 2014-08-01 DIAGNOSIS — M545 Low back pain, unspecified: Secondary | ICD-10-CM

## 2014-08-01 DIAGNOSIS — R1033 Periumbilical pain: Secondary | ICD-10-CM

## 2014-08-01 LAB — CBC WITH DIFFERENTIAL/PLATELET
BASOS PCT: 0.6 % (ref 0.0–3.0)
Basophils Absolute: 0 10*3/uL (ref 0.0–0.1)
EOS ABS: 0.2 10*3/uL (ref 0.0–0.7)
Eosinophils Relative: 3.2 % (ref 0.0–5.0)
HEMATOCRIT: 41.9 % (ref 39.0–52.0)
Hemoglobin: 13.8 g/dL (ref 13.0–17.0)
LYMPHS ABS: 2.1 10*3/uL (ref 0.7–4.0)
Lymphocytes Relative: 34 % (ref 12.0–46.0)
MCHC: 32.9 g/dL (ref 30.0–36.0)
MCV: 86.4 fl (ref 78.0–100.0)
MONO ABS: 0.8 10*3/uL (ref 0.1–1.0)
Monocytes Relative: 12.6 % — ABNORMAL HIGH (ref 3.0–12.0)
NEUTROS PCT: 49.6 % (ref 43.0–77.0)
Neutro Abs: 3.1 10*3/uL (ref 1.4–7.7)
PLATELETS: 228 10*3/uL (ref 150.0–400.0)
RBC: 4.85 Mil/uL (ref 4.22–5.81)
RDW: 15 % (ref 11.5–15.5)
WBC: 6.3 10*3/uL (ref 4.0–10.5)

## 2014-08-01 LAB — COMPREHENSIVE METABOLIC PANEL
ALT: 13 U/L (ref 0–53)
AST: 15 U/L (ref 0–37)
Albumin: 3.9 g/dL (ref 3.5–5.2)
Alkaline Phosphatase: 57 U/L (ref 39–117)
BUN: 17 mg/dL (ref 6–23)
CO2: 26 meq/L (ref 19–32)
Calcium: 9.7 mg/dL (ref 8.4–10.5)
Chloride: 102 mEq/L (ref 96–112)
Creatinine, Ser: 1.33 mg/dL (ref 0.40–1.50)
GFR: 66.56 mL/min (ref >60.00)
Glucose, Bld: 111 mg/dL — ABNORMAL HIGH (ref 70–99)
Potassium: 4.1 mEq/L (ref 3.5–5.1)
SODIUM: 134 meq/L — AB (ref 135–145)
TOTAL PROTEIN: 8 g/dL (ref 6.0–8.3)
Total Bilirubin: 0.5 mg/dL (ref 0.2–1.2)

## 2014-08-01 LAB — AMYLASE: Amylase: 98 U/L (ref 27–131)

## 2014-08-01 LAB — LIPASE: LIPASE: 14 U/L (ref 11.0–59.0)

## 2014-08-01 NOTE — Telephone Encounter (Signed)
I contacted the pt. He is coming in for a follow up on July 12 the 945 am.

## 2014-08-01 NOTE — Patient Instructions (Addendum)
   Your physician has requested that you go to the basement for the lab work before leaving today.    You have been scheduled for a CT scan of the abdomen and pelvis at [facility] ([address]---this is in the same building as [facility]).   You are scheduled on 08/08/14 at 8:30AM. You should arrive 15 minutes prior to your appointment time for registration. Please follow the written instructions below on the day of your exam:  WARNING: IF YOU ARE ALLERGIC TO IODINE/X-RAY DYE, PLEASE NOTIFY RADIOLOGY IMMEDIATELY AT [phone]! YOU WILL BE GIVEN A 13 HOUR PREMEDICATION PREP.  1) Do not eat or drink anything after 6:30AM (2 hours prior to your test)  You may take any medications as prescribed with a small amount of water except for the following: Metformin, Glucophage, Glucovance, Avandamet, Riomet, Fortamet, Actoplus Met, Janumet, Glumetza or Metaglip. The above medications must be held the day of the exam AND 48 hours after the exam.   This test typically takes 30-45 minutes to complete.  If you have any questions regarding your exam or if you need to reschedule, you may call the CT department at [phone] between the hours of 8:00 am and 5:00 pm, Monday-Friday.  ________________________________________________________________________  I appreciate the opportunity to care for you. Silvano Rusk, M.D., [facility]

## 2014-08-01 NOTE — Telephone Encounter (Signed)
Pt was seen by GI today and when they checked his BP it was high they referred him to call over here to see if we need to address this he does not remember for sure what is was but thought is was 152/88
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
## 2014-08-08 ENCOUNTER — Ambulatory Visit (INDEPENDENT_AMBULATORY_CARE_PROVIDER_SITE_OTHER)
Admission: RE | Admit: 2014-08-08 | Discharge: 2014-08-08 | Disposition: A | Discharge status: Home / Self Care | Payer: Commercial Managed Care - HMO | Source: Ambulatory Visit | Attending: Internal Medicine | Admitting: Internal Medicine

## 2014-08-08 DIAGNOSIS — I700 Atherosclerosis of aorta: Secondary | ICD-10-CM

## 2014-08-08 DIAGNOSIS — R1033 Periumbilical pain: Secondary | ICD-10-CM | POA: Diagnosis not present

## 2014-08-08 MED ORDER — IOHEXOL 350 MG/ML SOLN
100.0000 mL | Freq: Once | INTRAVENOUS | Status: AC | PRN
  Administered 2014-08-08: 100 mL via INTRAVENOUS

## 2014-08-09 NOTE — Progress Notes (Signed)
Quick Note:  CT shows some atherosclerosis on arteries but no signs of compromised blood flow Next step is to have a screening colonoscopy - he should be aware of this plan - dx for that to repeat is screening ______

## 2014-08-18 ENCOUNTER — Ambulatory Visit (AMBULATORY_SURGERY_CENTER): Payer: Self-pay | Admitting: *Deleted

## 2014-08-18 VITALS — Ht 70.0 in | Wt 250.0 lb

## 2014-08-18 DIAGNOSIS — Z1211 Encounter for screening for malignant neoplasm of colon: Secondary | ICD-10-CM

## 2014-08-18 NOTE — Progress Notes (Signed)
No egg or soy allergy  No anesthesia or intubation problems per pt  No diet medications taken  Registered in EMMI   

## 2014-08-23 ENCOUNTER — Telehealth: Payer: Self-pay

## 2014-08-23 NOTE — Telephone Encounter (Signed)
PT HAD QUESTIONS ABOUT MIXING MIRALAX PREP.  EXPLAINED TO PT AND WENT DOWN HIS INSTRUCTIONS SHEET.  HE STATES HE UNDERSTOOD THE INSTRUCTIONS.  NO FURTHER QUESTIONS. MAW

## 2014-08-25 ENCOUNTER — Ambulatory Visit (AMBULATORY_SURGERY_CENTER): Payer: Medicare HMO | Admitting: Internal Medicine

## 2014-08-25 ENCOUNTER — Encounter: Payer: Self-pay | Admitting: Internal Medicine

## 2014-08-25 VITALS — BP 144/89 | HR 61 | Temp 98.2°F | Resp 14 | Ht 70.0 in | Wt 250.0 lb

## 2014-08-25 DIAGNOSIS — Z1211 Encounter for screening for malignant neoplasm of colon: Secondary | ICD-10-CM | POA: Diagnosis not present

## 2014-08-25 DIAGNOSIS — D120 Benign neoplasm of cecum: Secondary | ICD-10-CM

## 2014-08-25 DIAGNOSIS — D124 Benign neoplasm of descending colon: Secondary | ICD-10-CM | POA: Diagnosis not present

## 2014-08-25 DIAGNOSIS — D123 Benign neoplasm of transverse colon: Secondary | ICD-10-CM

## 2014-08-25 DIAGNOSIS — D122 Benign neoplasm of ascending colon: Secondary | ICD-10-CM

## 2014-08-25 MED ORDER — SODIUM CHLORIDE 0.9 % IV SOLN: 500.0000 mL | INTRAVENOUS | Status: DC

## 2014-08-25 NOTE — Patient Instructions (Addendum)
I found and removed 6 polyps today - one was large and looked like it would have become cancer -I am glad we did this.  I will let you know pathology results and when to have another routine colonoscopy by mail.  I appreciate the opportunity to care for you. Gatha Mayer, MD, FACG  YOU HAD AN ENDOSCOPIC PROCEDURE TODAY AT [facility]:   Refer to the procedure report that was given to you for any specific questions about what was found during the examination.  If the procedure report does not answer your questions, please call your gastroenterologist to clarify.  If you requested that your care partner not be given the details of your procedure findings, then the procedure report has been included in a sealed envelope for you to review at your convenience later.  YOU SHOULD EXPECT: Some feelings of bloating in the abdomen. Passage of more gas than usual.  Walking can help get rid of the air that was put into your GI tract during the procedure and reduce the bloating. If you had a lower endoscopy (such as a colonoscopy or flexible sigmoidoscopy) you may notice spotting of blood in your stool or on the toilet paper. If you underwent a bowel prep for your procedure, you may not have a normal bowel movement for a few days.  Please Note:  You might notice some irritation and congestion in your nose or some drainage.  This is from the oxygen used during your procedure.  There is no need for concern and it should clear up in a day or so.  SYMPTOMS TO REPORT IMMEDIATELY:   Following lower endoscopy (colonoscopy or flexible sigmoidoscopy):  Excessive amounts of blood in the stool  Significant tenderness or worsening of abdominal pains  Swelling of the abdomen that is new, acute  Fever of 100F or higher  For urgent or emergent issues, a gastroenterologist can be reached at any hour by calling [phone].   DIET: Your first meal following the procedure should be a small  meal and then it is ok to progress to your normal diet. Heavy or fried foods are harder to digest and may make you feel nauseous or bloated.  Likewise, meals heavy in dairy and vegetables can increase bloating.  Drink plenty of fluids but you should avoid alcoholic beverages for 24 hours.  ACTIVITY:  You should plan to take it easy for the rest of today and you should NOT DRIVE or use heavy machinery until tomorrow (because of the sedation medicines used during the test).    FOLLOW UP: Our staff will call the number listed on your records the next business day following your procedure to check on you and address any questions or concerns that you may have regarding the information given to you following your procedure. If we do not reach you, we will leave a message.  However, if you are feeling well and you are not experiencing any problems, there is no need to return our call.  We will assume that you have returned to your regular daily activities without incident.  If any biopsies were taken you will be contacted by phone or by letter within the next 1-3 weeks.  Please call us at [phone] if you have not heard about the biopsies in 3 weeks.    SIGNATURES/CONFIDENTIALITY: You and/or your care partner have signed paperwork which will be entered into your electronic medical record.  These signatures attest to the fact  that that the information above on your After Visit Summary has been reviewed and is understood.  Full responsibility of the confidentiality of this discharge information lies with you and/or your care-partner.  Hold aspirin, aspirin products and NSAIDs (ibuprofen, motrin, advil, aleve, naproxen, etc) for 2 weeks-until 09/08/14.

## 2014-08-25 NOTE — Op Note (Addendum)
[facility]  [address] [place], [id number]   COLONOSCOPY PROCEDURE REPORT  PATIENT: Miguel Blake, Miguel Blake  MR#: [id number] BIRTHDATE: 11/05/1932 , 81  yrs. old GENDER: male ENDOSCOPIST: Gatha Mayer, MD, [facility] PROCEDURE DATE:  08/25/2014 PROCEDURE:   Colonoscopy, screening, Colonoscopy with snare polypectomy, and Submucosal injection, any substance First Screening Colonoscopy - Avg.  risk and is 50 yrs.  old or older Yes.  Prior Negative Screening - Now for repeat screening. N/A  History of Adenoma - Now for follow-up colonoscopy & has been > or = to 3 yrs.  N/A  Polyps removed today? Yes ASA CLASS:   Class III INDICATIONS:Screening for colonic neoplasia and Colorectal Neoplasm Risk Assessment for this procedure is average risk. MEDICATIONS: Propofol 300 mg IV and Monitored anesthesia care  DESCRIPTION OF PROCEDURE:   After the risks benefits and alternatives of the procedure were thoroughly explained, informed consent was obtained.  The digital rectal exam revealed no abnormalities of the rectum, revealed no prostatic nodules, and revealed the prostate was not enlarged.   The [id number] endoscope was introduced through the anus and advanced to the cecum, which was identified by both the appendix and ileocecal valve. No adverse events experienced.   The quality of the prep was excellent.  (MiraLax was used)  The instrument was then slowly withdrawn as the colon was fully examined. Estimated blood loss is zero unless otherwise noted in this procedure report.   COLON FINDINGS: Polypoid shaped sessile polyp(s) ranging from 2 to 28mm in size were found in the ascending colon, at the cecum, in the descending colon, and transverse colon.  Endoscopic mucosal resection was performed on a descending colon polyp 25 mm size in a piecemeal fashion by injecting saline into the submucosa to raise the lesion.  Resection was then performed with snare cautery.   The polyp lifted well after submucosal injection There was no blood loss from polypectomy. This area was marked with submucosal SPOT tattoos proximal and distal. 5 clips placed to close the defect. Polypectomies on the other polyps (max 6 mm) were performed using a cold snare.  The resection was complete, the polyp tissue was completely retrieved and sent to histology.   There was diverticulosis noted in the sigmoid colon.   The examination was otherwise normal.  Retroflexed views revealed no abnormalities. The time to cecum = 2.0 Withdrawal time = 33.7   The scope was withdrawn and the procedure completed. COMPLICATIONS: There were no immediate complications.    ENDOSCOPIC IMPRESSION:  1.   Sessile polyp(s) ranging from 2 to 45mm in size were found in the ascending colon, at the cecum, in the descending colon, and transverse colon; endoscopic mucosal resection was performed on the 25 mm polyp; polypectomies were performed using a cold snare on others (max 6 mm) 2.   Diverticulosis was noted in the sigmoid colon 3.   The examination was otherwise normal - excellent prep - first screening  RECOMMENDATIONS: 1.  Hold Aspirin and all other NSAIDS for 2 weeks. 2.  Timing of repeat colonoscopy will be determined by pathology findings.  eSigned:  Gatha Mayer, MD, [facility] 08/25/2014 4:46 PM Revised: 08/25/2014 4:46 PM  cc: The Patient and Landry Mellow, MD   PATIENT NAME:  Miguel Blake, Miguel Blake MR#: [id number]

## 2014-08-25 NOTE — Progress Notes (Signed)
Called to room to assist during endoscopic procedure.  Patient ID and intended procedure confirmed with present staff. Received instructions for my participation in the procedure from the performing physician.  

## 2014-08-25 NOTE — Progress Notes (Signed)
Patient awakening,vss,report to rn 

## 2014-08-26 ENCOUNTER — Telehealth: Payer: Self-pay | Admitting: *Deleted

## 2014-08-26 NOTE — Telephone Encounter (Signed)
  Follow up Call-  Call back number 08/25/2014  Post procedure Call Back phone  # [phone]  Permission to leave phone message Yes     Patient questions:  Do you have a fever, pain , or abdominal swelling? No. Pain Score  0 *  Have you tolerated food without any problems? Yes.    Have you been able to return to your normal activities? Yes.    Do you have any questions about your discharge instructions: Diet   No. Medications  No. Follow up visit  No.  Do you have questions or concerns about your Care? No.  Actions: * If pain score is 4 or above: No action needed, pain <4.

## 2014-08-30 ENCOUNTER — Ambulatory Visit (INDEPENDENT_AMBULATORY_CARE_PROVIDER_SITE_OTHER): Payer: Medicare HMO | Admitting: Endocrinology

## 2014-08-30 ENCOUNTER — Encounter: Payer: Self-pay | Admitting: Endocrinology

## 2014-08-30 VITALS — BP 158/92 | HR 81 | Temp 97.8°F | Resp 16 | Ht 70.0 in | Wt 250.0 lb

## 2014-08-30 DIAGNOSIS — I10 Essential (primary) hypertension: Secondary | ICD-10-CM | POA: Diagnosis not present

## 2014-08-30 MED ORDER — AMLODIPINE BESYLATE 5 MG PO TABS: 5.0000 mg | ORAL_TABLET | Freq: Every day | ORAL | Status: DC

## 2014-08-30 NOTE — Patient Instructions (Addendum)
Please come back for a regular physical appointment in 3 months (must be after 12/02/14). i have sent a prescription to your pharmacy, for an additional blood pressure pill.

## 2014-08-30 NOTE — Progress Notes (Signed)
Subjective:    Patient ID: Miguel Blake, male    DOB: 01-03-1933, 81 y.o.   MRN: [id number]  HPI He takes hyzaar as rx'ed.  No change in chronic doe Past Medical History  Diagnosis Date  . DEMENTIA 09/15/2006  . HYPERTENSION 09/15/2006  . OSTEOARTHRITIS 09/15/2006  . BENIGN PROSTATIC HYPERTROPHY, WITH OBSTRUCTION 05/11/2008  . 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 HYPERCHOLESTEROLEMIA 05/11/2008  . Increased prostate specific antigen (PSA) velocity   . Non-cardiogenic pulmonary edema   . Anxiety   . Cancer     prostate C  . GERD (gastroesophageal reflux disease)     Past Surgical History  Procedure Laterality Date  . Exercise stress cardiolite  01/17/2003  . Doppler echocardiography  08/27/2001  . Electrocardiogram  11/21/2005  . Appendectomy  unsure of date  . Cataract extraction      right eye    History   Social History  . Marital Status: Legally Separated    Spouse Name: N/A  . Number of Children: 4  . Years of Education: 10th grade   Occupational History  . 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     Social History Main Topics  . Smoking status: Former Smoker -- 1.00 packs/day for 20 years    Types: Cigarettes    Quit date: 02/18/1973  . Smokeless tobacco: Never Used  . Alcohol Use: No     Comment: Quit drinking-heavy drinker  . Drug Use: No  . Sexual Activity: Not on file   Other Topics Concern  . Not on file   Social History Narrative   Retired Financial controller    Current Outpatient Prescriptions on File Prior to Visit  Medication Sig Dispense Refill  . citalopram (CELEXA) 20 MG tablet Take 20 mg by mouth daily.    Marland Kitchen ezetimibe (ZETIA) 10 MG tablet Take 1 tablet (10 mg total) by mouth daily. 90 tablet 0  . famotidine (PEPCID) 20 MG tablet One at bedtime 30 tablet 11  . finasteride (PROSCAR) 5 MG tablet Take 5 mg by mouth daily.    Marland Kitchen losartan-hydrochlorothiazide (HYZAAR) 100-25 MG per tablet Take  1 tablet by mouth daily.    Marland Kitchen omeprazole (PRILOSEC) 20 MG capsule Take 1 capsule (20 mg total) by mouth daily. 30 capsule 11  . tamsulosin (FLOMAX) 0.4 MG CAPS capsule Take 0.4 mg by mouth daily.     No current facility-administered medications on file prior to visit.    Allergies  Allergen Reactions  . Enalapril Maleate     Unsure of reaction  . Fluvastatin Sodium     REACTION: Myalgia  . Penicillins     "made me feel like I was going to die"    Family History  Problem Relation Age of Onset  . Prostate cancer Father   . Colon cancer Neg Hx   . Colon polyps Neg Hx   . Esophageal cancer Neg Hx   . Heart disease Neg Hx   . Diabetes Neg Hx   . Gallbladder disease Neg Hx   . Kidney disease Neg Hx   . Rectal cancer Neg Hx   . Stomach cancer Neg Hx     BP 158/92 mmHg  Pulse 81  Temp(Src) 97.8 F (36.6 C) (Oral)  Resp 16  Ht 5\' 10"  (1.778 m)  Wt 250 lb (113.399 kg)  BMI 35.87 kg/m2  SpO2 96%  Review of Systems Denies chest pain.  Objective:   Physical Exam VITAL SIGNS:  See vs page GENERAL: no distress Ext: no edema  Lab Results  Component Value Date   CREATININE 1.33 08/01/2014   BUN 17 08/01/2014   NA 134* 08/01/2014   K 4.1 08/01/2014   CL 102 08/01/2014   CO2 26 08/01/2014         Assessment & Plan:  HTN: worse  Patient is advised the following: Patient Instructions  Please come back for a regular physical appointment in 3 months (must be after 12/02/14). i have sent a prescription to your pharmacy, for an additional blood pressure pill.

## 2014-09-05 ENCOUNTER — Encounter: Payer: Self-pay | Admitting: Internal Medicine

## 2014-09-05 DIAGNOSIS — Z860101 Personal history of adenomatous and serrated colon polyps: Secondary | ICD-10-CM | POA: Insufficient documentation

## 2014-09-05 DIAGNOSIS — Z86010 Personal history of colonic polyps: Secondary | ICD-10-CM

## 2014-09-05 HISTORY — DX: Personal history of colonic polyps: Z86.010

## 2014-09-05 HISTORY — DX: Personal history of adenomatous and serrated colon polyps: Z86.0101

## 2014-09-05 NOTE — Progress Notes (Signed)
Quick Note:  Adenomas and large TV adenoma Repeat flex sig before end of 2016 - full prep - APC available ______

## 2014-10-27 ENCOUNTER — Other Ambulatory Visit: Payer: Self-pay

## 2014-10-27 MED ORDER — EZETIMIBE 10 MG PO TABS: 10.0000 mg | ORAL_TABLET | Freq: Every day | ORAL | Status: DC

## 2014-10-27 MED ORDER — AMLODIPINE BESYLATE 5 MG PO TABS: 5.0000 mg | ORAL_TABLET | Freq: Every day | ORAL | Status: DC

## 2014-11-30 ENCOUNTER — Ambulatory Visit (INDEPENDENT_AMBULATORY_CARE_PROVIDER_SITE_OTHER): Payer: Medicare HMO | Admitting: Endocrinology

## 2014-11-30 ENCOUNTER — Encounter: Payer: Self-pay | Admitting: Endocrinology

## 2014-11-30 VITALS — BP 138/84 | HR 79 | Temp 98.0°F | Ht 70.0 in | Wt 252.0 lb

## 2014-11-30 DIAGNOSIS — Z23 Encounter for immunization: Secondary | ICD-10-CM | POA: Diagnosis not present

## 2014-11-30 DIAGNOSIS — Z0000 Encounter for general adult medical examination without abnormal findings: Secondary | ICD-10-CM

## 2014-11-30 MED ORDER — OMEPRAZOLE 20 MG PO CPDR: 20.0000 mg | DELAYED_RELEASE_CAPSULE | Freq: Every day | ORAL | Status: DC

## 2014-11-30 MED ORDER — TRAMADOL HCL 50 MG PO TABS: 50.0000 mg | ORAL_TABLET | Freq: Three times a day (TID) | ORAL | Status: DC | PRN

## 2014-11-30 MED ORDER — FAMOTIDINE 20 MG PO TABS: ORAL_TABLET | ORAL | Status: DC

## 2014-11-30 NOTE — Progress Notes (Signed)
Subjective:    Patient ID: Miguel Blake, male    DOB: Sep 15, 1932, 81 y.o.   MRN: [id number]  HPI Pt states few years of intermittent moderate pain at the right shoulder.  No assoc numbness.  No injury. Past Medical History  Diagnosis Date  . DEMENTIA 09/15/2006  . HYPERTENSION 09/15/2006  . OSTEOARTHRITIS 09/15/2006  . BENIGN PROSTATIC HYPERTROPHY, WITH OBSTRUCTION 05/11/2008  . 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 HYPERCHOLESTEROLEMIA 05/11/2008  . Increased prostate specific antigen (PSA) velocity   . Non-cardiogenic pulmonary edema   . Anxiety   . Cancer ([place])     prostate C  . GERD (gastroesophageal reflux disease)   . Hx of adenomatous colonic polyps 09/05/2014    Past Surgical History  Procedure Laterality Date  . Exercise stress cardiolite  01/17/2003  . Doppler echocardiography  08/27/2001  . Electrocardiogram  11/21/2005  . Appendectomy  unsure of date  . Cataract extraction      right eye    Social History   Social History  . Marital Status: Legally Separated    Spouse Name: N/A  . Number of Children: 4  . Years of Education: 10th grade   Occupational History  . 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     Social History Main Topics  . Smoking status: Former Smoker -- 1.00 packs/day for 20 years    Types: Cigarettes    Quit date: 02/18/1973  . Smokeless tobacco: Never Used  . Alcohol Use: No     Comment: Quit drinking-heavy drinker  . Drug Use: No  . Sexual Activity: Not on file   Other Topics Concern  . Not on file   Social History Narrative   Retired Financial controller    Current Outpatient Prescriptions on File Prior to Visit  Medication Sig Dispense Refill  . amLODipine (NORVASC) 5 MG tablet Take 1 tablet (5 mg total) by mouth daily. 90 tablet 3  . citalopram (CELEXA) 20 MG tablet Take 20 mg by mouth daily.    Marland Kitchen ezetimibe (ZETIA) 10 MG tablet Take 1 tablet (10 mg total) by mouth daily. 90  tablet 0  . finasteride (PROSCAR) 5 MG tablet Take 5 mg by mouth daily.    Marland Kitchen losartan-hydrochlorothiazide (HYZAAR) 100-25 MG per tablet Take 1 tablet by mouth daily.    . tamsulosin (FLOMAX) 0.4 MG CAPS capsule Take 0.4 mg by mouth daily.     No current facility-administered medications on file prior to visit.    Allergies  Allergen Reactions  . Enalapril Maleate     Unsure of reaction  . Fluvastatin Sodium     REACTION: Myalgia  . Penicillins     "made me feel like I was going to die"    Family History  Problem Relation Age of Onset  . Prostate cancer Father   . Colon cancer Neg Hx   . Colon polyps Neg Hx   . Esophageal cancer Neg Hx   . Heart disease Neg Hx   . Diabetes Neg Hx   . Gallbladder disease Neg Hx   . Kidney disease Neg Hx   . Rectal cancer Neg Hx   . Stomach cancer Neg Hx     BP 138/84 mmHg  Pulse 79  Temp(Src) 98 F (36.7 C) (Oral)  Ht 5\' 10"  (1.778 m)  Wt 252 lb (114.306 kg)  BMI 36.16 kg/m2  SpO2 95%    Review of Systems  He has dyspeptic sxs (he does not take prilosec or pepcid).  Denies BRBPR and dysphagia.      Objective:   Physical Exam VITAL SIGNS:  See vs page GENERAL: no distress Right shoulder: full ROM, but ROM is painful Neuro: sensation is intact to touch on the RUE Pulses: right radial is intact.     Radiol: right shoulder (2010): No acute fracture or subluxation. Mild degenerative changes right acromioclavicular joint. Mild spurring of the acromion.      Assessment & Plan:  Shoulder pain, worse again. Depression: there is an interaction between ultram and celexa, but he is on low dosage of both.   Dyspepsia: recurrent off rx.   HTN: well-controlled Please continue the same medication for blood pressure. Here is a prescription, for the pain. i have sent a prescription to your pharmacy, to refill both of your stomach pills.   Let me know if you want to see a specialist for the shoulder pain.    Subjective:   Patient  here for Medicare annual wellness visit and management of other chronic and acute problems.     Risk factors: advanced age    65 of Physicians Providing Medical Care to Patient:  See "snapshot"   Activities of Daily Living: In your present state of health, do you have any difficulty performing the following activities?:  Preparing food and eating?: No  Bathing yourself: No  Getting dressed: No  Using the toilet:No  Moving around from place to place: No  In the past year have you fallen or had a near fall?:No    Home Safety: Has smoke detector and wears seat belts. Firearms are safely stored.  Diet and Exercise  Current exercise habits: pt says good Dietary issues discussed: pt reports a healthy diet   Depression Screen  Q1: Over the past two weeks, have you felt down, depressed or hopeless? no  Q2: Over the past two weeks, have you felt little interest or pleasure in doing things? no   The following portions of the patient's history were reviewed and updated as appropriate: allergies, current medications, past family history, past medical history, past social history, past surgical history and problem list.   Review of Systems  Denies hearing loss, and visual loss Objective:   Vision:  Advertising account executive, but does not recall name Hearing: grossly normal Body mass index:  See vs page Msk: pt easily and quickly performs "get-up-and-go" from a sitting position Cognitive Impairment Assessment: cognition, memory and judgment appear normal.  remembers 2/3 at 5 minutes (? effort).  excellent recall.  can easily read and write a sentence.  alert and oriented x 3.     Assessment:   Medicare wellness utd on preventive parameters    Plan:   During the course of the visit the patient was educated and counseled about appropriate screening and preventive services including:       Fall prevention   Diabetes screening  Nutrition counseling   Vaccines / LABS Zostavax /  Pneumococcal Vaccine  today  PSA is up to date  Patient Instructions (the written plan) was given to the patient.

## 2014-11-30 NOTE — Patient Instructions (Addendum)
Please continue the same medication for blood pressure. Here is a prescription, for the pain. i have sent a prescription to your pharmacy, to refill both of your stomach pills.   Please come in soon, for your annual physical. Let me know if you want to see a specialist for the shoulder pain.  please consider these measures for your health:  minimize alcohol.  do not use tobacco products.  have a colonoscopy at least every 10 years from age 81. keep firearms safely stored.  always use seat belts.  have working smoke alarms in your home.  see an eye doctor and dentist regularly.  never drive under the influence of alcohol or drugs (including prescription drugs).   it is critically important to prevent falling down (keep floor areas well-lit, dry, and free of loose objects.  If you have a cane, walker, or wheelchair, you should use it, even for short trips around the house.  Also, try not to rush).

## 2014-11-30 NOTE — Progress Notes (Signed)
we discussed code status.  pt requests full code, but would not want to be started or maintained on artificial life-support measures if there was not a reasonable chance of recovery 

## 2014-12-01 ENCOUNTER — Telehealth: Payer: Self-pay | Admitting: Endocrinology

## 2014-12-01 NOTE — Telephone Encounter (Signed)
Patient stated that his pharmacy haven't received medication for his arthritis and stomach. Please advise

## 2014-12-01 NOTE — Telephone Encounter (Signed)
[facility] states they did not have the 3 rx we called in for yesterday please contact [facility] to discuss

## 2014-12-01 NOTE — Telephone Encounter (Signed)
Patient called stating that his medications were not sent to his pharmacy   Rx: Omeprazole   Pharmacy: [facility]    Thank you

## 2014-12-02 DIAGNOSIS — I10 Essential (primary) hypertension: Secondary | ICD-10-CM | POA: Diagnosis not present

## 2014-12-02 DIAGNOSIS — E784 Other hyperlipidemia: Secondary | ICD-10-CM | POA: Diagnosis not present

## 2014-12-02 DIAGNOSIS — R69 Illness, unspecified: Secondary | ICD-10-CM | POA: Diagnosis not present

## 2014-12-02 NOTE — Telephone Encounter (Signed)
Shana at [facility] stated that they have the Rx and that it's ready for the pt to pick up

## 2014-12-14 ENCOUNTER — Other Ambulatory Visit: Payer: Self-pay | Admitting: Endocrinology

## 2014-12-14 ENCOUNTER — Ambulatory Visit (INDEPENDENT_AMBULATORY_CARE_PROVIDER_SITE_OTHER): Payer: Medicare HMO | Admitting: Endocrinology

## 2014-12-14 ENCOUNTER — Encounter: Payer: Self-pay | Admitting: Endocrinology

## 2014-12-14 VITALS — BP 146/94 | HR 75 | Temp 97.6°F | Ht 70.0 in | Wt 253.0 lb

## 2014-12-14 DIAGNOSIS — D500 Iron deficiency anemia secondary to blood loss (chronic): Secondary | ICD-10-CM | POA: Diagnosis not present

## 2014-12-14 DIAGNOSIS — Z23 Encounter for immunization: Secondary | ICD-10-CM | POA: Diagnosis not present

## 2014-12-14 DIAGNOSIS — N400 Enlarged prostate without lower urinary tract symptoms: Secondary | ICD-10-CM

## 2014-12-14 DIAGNOSIS — Z125 Encounter for screening for malignant neoplasm of prostate: Secondary | ICD-10-CM

## 2014-12-14 DIAGNOSIS — E7800 Pure hypercholesterolemia, unspecified: Secondary | ICD-10-CM

## 2014-12-14 DIAGNOSIS — R079 Chest pain, unspecified: Secondary | ICD-10-CM

## 2014-12-14 DIAGNOSIS — I10 Essential (primary) hypertension: Secondary | ICD-10-CM | POA: Diagnosis not present

## 2014-12-14 DIAGNOSIS — R739 Hyperglycemia, unspecified: Secondary | ICD-10-CM

## 2014-12-14 DIAGNOSIS — Z0000 Encounter for general adult medical examination without abnormal findings: Secondary | ICD-10-CM

## 2014-12-14 LAB — BASIC METABOLIC PANEL
BUN: 19 mg/dL (ref 6–23)
CALCIUM: 9.7 mg/dL (ref 8.4–10.5)
CHLORIDE: 102 meq/L (ref 96–112)
CO2: 28 meq/L (ref 19–32)
CREATININE: 1.33 mg/dL (ref 0.40–1.50)
GFR: 66.5 mL/min (ref >60.00)
Glucose, Bld: 116 mg/dL — ABNORMAL HIGH (ref 70–99)
Potassium: 4.1 mEq/L (ref 3.5–5.1)
Sodium: 136 mEq/L (ref 135–145)

## 2014-12-14 LAB — TSH: TSH: 1.66 u[IU]/mL (ref 0.35–4.50)

## 2014-12-14 LAB — LIPID PANEL
CHOLESTEROL: 220 mg/dL — AB (ref 0–200)
HDL: 38.1 mg/dL — ABNORMAL LOW (ref >39.00)
LDL CALC: 159 mg/dL — AB (ref 0–99)
NONHDL: 181.89
Total CHOL/HDL Ratio: 6
Triglycerides: 113 mg/dL (ref 0.0–149.0)
VLDL: 22.6 mg/dL (ref 0.0–40.0)

## 2014-12-14 LAB — HEPATIC FUNCTION PANEL
ALK PHOS: 56 U/L (ref 39–117)
ALT: 14 U/L (ref 0–53)
AST: 14 U/L (ref 0–37)
Albumin: 3.7 g/dL (ref 3.5–5.2)
BILIRUBIN TOTAL: 0.3 mg/dL (ref 0.2–1.2)
Bilirubin, Direct: 0.1 mg/dL (ref 0.0–0.3)
Total Protein: 7.4 g/dL (ref 6.0–8.3)

## 2014-12-14 LAB — CBC WITH DIFFERENTIAL/PLATELET
BASOS PCT: 0.3 % (ref 0.0–3.0)
Basophils Absolute: 0 10*3/uL (ref 0.0–0.1)
EOS PCT: 3.3 % (ref 0.0–5.0)
Eosinophils Absolute: 0.2 10*3/uL (ref 0.0–0.7)
HCT: 40.2 % (ref 39.0–52.0)
HEMOGLOBIN: 13.1 g/dL (ref 13.0–17.0)
LYMPHS ABS: 1.8 10*3/uL (ref 0.7–4.0)
Lymphocytes Relative: 30.3 % (ref 12.0–46.0)
MCHC: 32.7 g/dL (ref 30.0–36.0)
MCV: 85.9 fl (ref 78.0–100.0)
MONO ABS: 0.7 10*3/uL (ref 0.1–1.0)
Monocytes Relative: 12 % (ref 3.0–12.0)
NEUTROS ABS: 3.2 10*3/uL (ref 1.4–7.7)
NEUTROS PCT: 54.1 % (ref 43.0–77.0)
Platelets: 229 10*3/uL (ref 150.0–400.0)
RBC: 4.67 Mil/uL (ref 4.22–5.81)
RDW: 15.3 % (ref 11.5–15.5)
WBC: 5.9 10*3/uL (ref 4.0–10.5)

## 2014-12-14 LAB — PSA: PSA: 4.07 ng/mL — AB (ref 0.10–4.00)

## 2014-12-14 LAB — HEMOGLOBIN A1C: Hgb A1c MFr Bld: 6.6 % — ABNORMAL HIGH (ref 4.6–6.5)

## 2014-12-14 NOTE — Progress Notes (Signed)
Subjective:    Patient ID: Miguel Blake, male    DOB: 06-16-1932, 81 y.o.   MRN: [id number]  HPI Pt is here for regular wellness examination, and is feeling pretty well in general, and says chronic med probs are stable, except as noted below Past Medical History  Diagnosis Date  . DEMENTIA 09/15/2006  . HYPERTENSION 09/15/2006  . OSTEOARTHRITIS 09/15/2006  . BENIGN PROSTATIC HYPERTROPHY, WITH OBSTRUCTION 05/11/2008  . 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 HYPERCHOLESTEROLEMIA 05/11/2008  . Increased prostate specific antigen (PSA) velocity   . Non-cardiogenic pulmonary edema   . Anxiety   . Cancer ([place])     prostate C  . GERD (gastroesophageal reflux disease)   . Hx of adenomatous colonic polyps 09/05/2014    Past Surgical History  Procedure Laterality Date  . Exercise stress cardiolite  01/17/2003  . Doppler echocardiography  08/27/2001  . Electrocardiogram  11/21/2005  . Appendectomy  unsure of date  . Cataract extraction      right eye    Social History   Social History  . Marital Status: Legally Separated    Spouse Name: N/A  . Number of Children: 4  . Years of Education: 10th grade   Occupational History  . 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     Social History Main Topics  . Smoking status: Former Smoker -- 1.00 packs/day for 20 years    Types: Cigarettes    Quit date: 02/18/1973  . Smokeless tobacco: Never Used  . Alcohol Use: No     Comment: Quit drinking-heavy drinker  . Drug Use: No  . Sexual Activity: Not on file   Other Topics Concern  . Not on file   Social History Narrative   Retired Financial controller    Current Outpatient Prescriptions on File Prior to Visit  Medication Sig Dispense Refill  . amLODipine (NORVASC) 5 MG tablet Take 1 tablet (5 mg total) by mouth daily. 90 tablet 3  . citalopram (CELEXA) 20 MG tablet Take 20 mg by mouth daily.    Marland Kitchen ezetimibe (ZETIA) 10 MG tablet Take 1  tablet (10 mg total) by mouth daily. 90 tablet 0  . finasteride (PROSCAR) 5 MG tablet Take 5 mg by mouth daily.    Marland Kitchen losartan-hydrochlorothiazide (HYZAAR) 100-25 MG per tablet Take 1 tablet by mouth daily.    Marland Kitchen omeprazole (PRILOSEC) 20 MG capsule Take 1 capsule (20 mg total) by mouth daily. 30 capsule 11  . tamsulosin (FLOMAX) 0.4 MG CAPS capsule Take 0.4 mg by mouth daily.    . traMADol (ULTRAM) 50 MG tablet Take 1 tablet (50 mg total) by mouth every 8 (eight) hours as needed. 30 tablet 1  . famotidine (PEPCID) 20 MG tablet One at bedtime (Patient not taking: Reported on 12/14/2014) 30 tablet 11   No current facility-administered medications on file prior to visit.    Allergies  Allergen Reactions  . Enalapril Maleate     Unsure of reaction  . Fluvastatin Sodium     REACTION: Myalgia  . Penicillins     "made me feel like I was going to die"    Family History  Problem Relation Age of Onset  . Prostate cancer Father   . Colon cancer Neg Hx   . Colon polyps Neg Hx   . Esophageal cancer Neg Hx   . Heart disease Neg Hx   . Diabetes Neg Hx   .  Gallbladder disease Neg Hx   . Kidney disease Neg Hx   . Rectal cancer Neg Hx   . Stomach cancer Neg Hx     BP 146/94 mmHg  Pulse 75  Temp(Src) 97.6 F (36.4 C) (Oral)  Ht 5\' 10"  (1.778 m)  Wt 253 lb (114.76 kg)  BMI 36.30 kg/m2  SpO2 95%   Review of Systems  Constitutional: Negative for fever.  HENT: Negative for hearing loss.   Eyes: Negative for visual disturbance.  Respiratory: Negative for cough.   Cardiovascular: Positive for leg swelling.  Gastrointestinal: Negative for anal bleeding.  Endocrine: Negative for cold intolerance.  Genitourinary: Negative for hematuria and difficulty urinating.  Musculoskeletal: Negative for back pain.  Skin: Negative for rash.  Allergic/Immunologic: Negative for environmental allergies.  Neurological: Negative for numbness.  Hematological: Does not bruise/bleed easily.    Psychiatric/Behavioral: Negative for dysphoric mood.       Objective:   Physical Exam VS: see vs page GEN: no distress HEAD: head: no deformity eyes: no periorbital swelling, no proptosis external nose and ears are normal mouth: no lesion seen NECK: supple, thyroid is not enlarged BREASTS:  No gynecomastia ABD: abdomen is soft, nontender.  no hepatosplenomegaly.  not distended.  no hernia GENITALIA/RECTAL/PROSTATE: sees urology MUSCULOSKELETAL: muscle bulk and strength are grossly normal.  no obvious joint swelling.  gait is normal and steady.   EXTEMITIES: no deformity.  no ulcer on the feet.  feet are of normal color and temp.  1+ bilat leg edema PULSES: dorsalis pedis intact bilat.  no carotid bruit.  NEURO:  cn 2-12 grossly intact.   readily moves all 4's.  sensation is intact to touch on the feet SKIN:  Normal texture and temperature.  No rash or suspicious lesion is visible.   NODES:  None palpable at the neck PSYCH: alert, well-oriented.  Does not appear anxious nor depressed.     Assessment & Plan:  Wellness visit today, with problems stable, except as noted.   SEPARATE EVALUATION FOLLOWS--EACH PROBLEM HERE IS NEW, NOT RESPONDING TO TREATMENT, OR POSES SIGNIFICANT RISK TO THE PATIENT'S HEALTH: HISTORY OF THE PRESENT ILLNESS:  Pt states few mos of intermittent pain at the left anterior chest.  It is non-exertional.  He has assoc doe   PAST MEDICAL HISTORY reviewed and up to date today.  REVIEW OF SYSTEMS: Denies weight change and syncope PHYSICAL EXAMINATION: VITAL SIGNS:  See vs page GENERAL: no distress LUNGS:  Clear to auscultation Chest wall: nontender.   HEART:  Regular rate and rhythm without murmurs noted. Normal S1,S2.   LAB/XRAY RESULTS: i personally reviewed electrocardiogram tracing (today): Indication: chest pain Impression: 1st degree AVB Lab Results  Component Value Date   CHOL 220* 12/14/2014   HDL 38.10* 12/14/2014   LDLCALC 159* 12/14/2014    LDLDIRECT 140.7 11/17/2012   TRIG 113.0 12/14/2014   CHOLHDL 6 12/14/2014   IMPRESSION: Chest pain, new, uncertain etiology.  Atypical  For cardiogenic pain Dyslipidemia: therapy is limited by perceived drug intolerance PLAN:  Ref cardiol i advised diet.

## 2014-12-14 NOTE — Telephone Encounter (Signed)
Rx sent per pt's request.  

## 2014-12-14 NOTE — Telephone Encounter (Signed)
See note below and please advise if ok to refill. Medication listed under a historical provider. Thanks!

## 2014-12-14 NOTE — Telephone Encounter (Signed)
Please refill prn 

## 2014-12-14 NOTE — Patient Instructions (Addendum)
blood tests are requested for you today.  We'll let you know about the results.  Please see a heart specialist.  you will receive a phone call, about a day and time for an appointment.  please consider these measures for your health:  minimize alcohol.  do not use tobacco products.  have a colonoscopy at least every 10 years from age 80.  keep firearms safely stored.  always use seat belts.  have working smoke alarms in your home.  see an eye doctor and dentist regularly.  never drive under the influence of alcohol or drugs (including prescription drugs).   it is critically important to prevent falling down (keep floor areas well-lit, dry, and free of loose objects.  If you have a cane, walker, or wheelchair, you should use it, even for short trips around the house.  Also, try not to rush). Please come back for a follow-up appointment in 6 months

## 2014-12-14 NOTE — Telephone Encounter (Signed)
Patient need refill of tamsulosin (FLOMAX) 0.4 MG CAPS capsule, send to   [id number] - [place], [address] AT [address] [phone] (Phone) [phone] (Fax)

## 2014-12-15 NOTE — Telephone Encounter (Signed)
Error

## 2014-12-19 MED ORDER — TAMSULOSIN HCL 0.4 MG PO CAPS: 0.4000 mg | ORAL_CAPSULE | Freq: Every day | ORAL | Status: DC

## 2014-12-19 NOTE — Telephone Encounter (Signed)
Pt states the pneumonia shot seems to be causing him pain and swelling at the inj site

## 2014-12-21 ENCOUNTER — Telehealth: Payer: Self-pay | Admitting: Endocrinology

## 2014-12-21 NOTE — Telephone Encounter (Signed)
See note below and please advise, Thanks! 

## 2014-12-21 NOTE — Telephone Encounter (Signed)
i would be happy to check this at ov, but it gets better on its own.

## 2014-12-21 NOTE — Telephone Encounter (Signed)
I contacted the pt and advised of note below. Pt voiced understanding and declined making a office visit at this time.

## 2014-12-21 NOTE — Telephone Encounter (Signed)
Patient called stating that he is having arm pain  He had the pneumonia shot done   (R) Arm  (+) pain (+)swelling (-)hot to touch   Please advise    Thank you

## 2014-12-23 ENCOUNTER — Ambulatory Visit: Payer: Commercial Managed Care - HMO | Admitting: Endocrinology

## 2014-12-27 DIAGNOSIS — N5201 Erectile dysfunction due to arterial insufficiency: Secondary | ICD-10-CM | POA: Diagnosis not present

## 2014-12-27 DIAGNOSIS — N138 Other obstructive and reflux uropathy: Secondary | ICD-10-CM | POA: Diagnosis not present

## 2014-12-27 DIAGNOSIS — C61 Malignant neoplasm of prostate: Secondary | ICD-10-CM | POA: Diagnosis not present

## 2014-12-27 DIAGNOSIS — N401 Enlarged prostate with lower urinary tract symptoms: Secondary | ICD-10-CM | POA: Diagnosis not present

## 2015-01-06 ENCOUNTER — Ambulatory Visit: Payer: Commercial Managed Care - HMO | Admitting: Cardiovascular Disease

## 2015-01-16 ENCOUNTER — Telehealth: Payer: Self-pay

## 2015-01-16 ENCOUNTER — Telehealth: Payer: Self-pay | Admitting: Endocrinology

## 2015-01-16 MED ORDER — EZETIMIBE 10 MG PO TABS: 10.0000 mg | ORAL_TABLET | Freq: Every day | ORAL | Status: DC

## 2015-01-16 NOTE — Telephone Encounter (Signed)
Pt needs return call from [facility] and see if we can all in [facility]  Also, can he take tylenol for arthritis

## 2015-01-16 NOTE — Telephone Encounter (Signed)
Spoke with patient about his need for a flex sigmoidoscopy recall, dx: polyp.  Per Dr Carlean Purl will be set up at [place] for his January hospital week.  Checked with patient to make sure he will be in town and he will.  Will speak with ENDO tomorrow to get date/time as they are closed now. Will call patient back with that information.

## 2015-01-16 NOTE — Telephone Encounter (Signed)
Requested a call back from the pt to discuss.  

## 2015-01-16 NOTE — Telephone Encounter (Signed)
I contacted the pt. Patient stated he needed a refill on his Zetia. Rx refilled. Pt was advised to take aleve for his arthritis pain

## 2015-01-17 NOTE — Telephone Encounter (Signed)
Per Dr Carlean Purl left patient message that we could do his flex December 20th if he prefers that instead of a Jan. Date.  I will await his call.

## 2015-01-19 ENCOUNTER — Other Ambulatory Visit: Payer: Self-pay

## 2015-01-19 DIAGNOSIS — Z86010 Personal history of colonic polyps: Secondary | ICD-10-CM

## 2015-01-19 NOTE — Telephone Encounter (Signed)
Spoke with patient and set up Flex Sig with FULL COLON PREP PER DR CG for 03/06/15 at 10:15AM, arrive 8:45AM at [place].  Pre-Op appt made for 02/14/15 at 2:30pm.  Patient aware of appointment dates and times.

## 2015-01-20 ENCOUNTER — Telehealth: Payer: Self-pay | Admitting: Cardiology

## 2015-01-20 NOTE — Telephone Encounter (Signed)
Close encounter 

## 2015-01-24 NOTE — Progress Notes (Signed)
HPI: 81 year old male for evaluation of dyspnea. Patient complains of chronic dyspnea on exertion. He does not have orthopnea PND, pedal edema, chest pain or syncope. He apparently has been evaluated and told he does not have lung disease.  Current Outpatient Prescriptions  Medication Sig Dispense Refill  . amLODipine (NORVASC) 5 MG tablet Take 1 tablet (5 mg total) by mouth daily. 90 tablet 3  . citalopram (CELEXA) 20 MG tablet Take 20 mg by mouth daily.    Marland Kitchen ezetimibe (ZETIA) 10 MG tablet Take 1 tablet (10 mg total) by mouth daily. 90 tablet 3  . finasteride (PROSCAR) 5 MG tablet Take 5 mg by mouth daily.    Marland Kitchen losartan-hydrochlorothiazide (HYZAAR) 100-25 MG per tablet Take 1 tablet by mouth daily.    Marland Kitchen omeprazole (PRILOSEC) 20 MG capsule Take 1 capsule (20 mg total) by mouth daily. 30 capsule 11  . tamsulosin (FLOMAX) 0.4 MG CAPS capsule Take 1 capsule (0.4 mg total) by mouth daily. 30 capsule 4  . traMADol (ULTRAM) 50 MG tablet Take 1 tablet (50 mg total) by mouth every 8 (eight) hours as needed. 30 tablet 1   No current facility-administered medications for this visit.    Allergies  Allergen Reactions  . Enalapril Maleate     Unsure of reaction  . Fluvastatin Sodium     REACTION: Myalgia  . Penicillins     "made me feel like I was going to die"     Past Medical History  Diagnosis Date  . DEMENTIA 09/15/2006  . HYPERTENSION 09/15/2006  . OSTEOARTHRITIS 09/15/2006  . BENIGN PROSTATIC HYPERTROPHY, WITH OBSTRUCTION 05/11/2008  . 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 HYPERCHOLESTEROLEMIA 05/11/2008  . Increased prostate specific antigen (PSA) velocity   . Non-cardiogenic pulmonary edema   . Anxiety   . Cancer ([place])     prostate C  . GERD (gastroesophageal reflux disease)   . Hx of adenomatous colonic polyps 09/05/2014    Past Surgical History  Procedure Laterality Date  . Exercise stress cardiolite  01/17/2003  .  Doppler echocardiography  08/27/2001  . Electrocardiogram  11/21/2005  . Appendectomy  unsure of date  . Cataract extraction      right eye    Social History   Social History  . Marital Status: Widowed    Spouse Name: N/A  . Number of Children: 4  . Years of Education: 10th grade   Occupational History  . 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     Social History Main Topics  . Smoking status: Former Smoker -- 1.00 packs/day for 20 years    Types: Cigarettes    Quit date: 02/18/1973  . Smokeless tobacco: Never Used  . Alcohol Use: No     Comment: Quit drinking-heavy drinker  . Drug Use: No  . Sexual Activity: Not on file   Other Topics Concern  . Not on file   Social History Narrative   Retired Financial controller    Family History  Problem Relation Age of Onset  . Prostate cancer Father   . Colon cancer Neg Hx   . Colon polyps Neg Hx   . Esophageal cancer Neg Hx   . Heart disease Neg Hx   . Diabetes Neg Hx   . Gallbladder disease Neg Hx   . Kidney disease Neg Hx   . Rectal cancer Neg Hx   . Stomach cancer Neg Hx   .  Hypertension Mother     ROS: Arthralgias and back pain but no fevers or chills, productive cough, hemoptysis, dysphasia, odynophagia, melena, hematochezia, dysuria, hematuria, rash, seizure activity, orthopnea, PND, claudication. Remaining systems are negative.  Physical Exam:   Blood pressure 124/74, pulse 88, height 5\' 10"  (1.778 m), weight 256 lb 6.4 oz (116.302 kg).  General:  Well developed/well nourished in NAD Skin warm/dry Patient not depressed No peripheral clubbing Back-normal HEENT-normal/normal eyelids Neck supple/normal carotid upstroke bilaterally; no bruits; no JVD; no thyromegaly chest - CTA/ normal expansion CV - RRR/normal S1 and S2; no rubs or gallops;  PMI nondisplaced, 1/6 systolic murmur left sternal border. Abdomen -NT/ND, no HSM, no mass, + bowel sounds, no bruit 2+ femoral pulses, no bruits Ext-trace edema, no  chords, diminished distal pulses Neuro-grossly nonfocal  ECG 12/14/2014-sinus rhythm, first-degree AV block.

## 2015-01-25 ENCOUNTER — Encounter: Payer: Self-pay | Admitting: Cardiology

## 2015-01-25 ENCOUNTER — Other Ambulatory Visit: Payer: Self-pay | Admitting: Endocrinology

## 2015-01-25 ENCOUNTER — Ambulatory Visit (INDEPENDENT_AMBULATORY_CARE_PROVIDER_SITE_OTHER): Payer: Medicare HMO | Admitting: Cardiology

## 2015-01-25 VITALS — BP 124/74 | HR 88 | Ht 70.0 in | Wt 256.4 lb

## 2015-01-25 DIAGNOSIS — R079 Chest pain, unspecified: Secondary | ICD-10-CM

## 2015-01-25 DIAGNOSIS — E7800 Pure hypercholesterolemia, unspecified: Secondary | ICD-10-CM

## 2015-01-25 DIAGNOSIS — I10 Essential (primary) hypertension: Secondary | ICD-10-CM | POA: Diagnosis not present

## 2015-01-25 DIAGNOSIS — R0600 Dyspnea, unspecified: Secondary | ICD-10-CM | POA: Diagnosis not present

## 2015-01-25 NOTE — Assessment & Plan Note (Signed)
Blood pressure controlled. Continue present medications. 

## 2015-01-25 NOTE — Assessment & Plan Note (Signed)
Etiology unclear. There may be a component of obesity hypoventilation syndrome and deconditioning. We will arrange an echocardiogram to assess LV function both systolic and diastolic. I will arrange a Framingham nuclear study for risk stratification.

## 2015-01-25 NOTE — Patient Instructions (Signed)
Medication Instructions:  Please continue your current medications.  Labwork: NONE  Testing/Procedures: 1. Your physician has requested that you have a lexiscan myoview. For further information please visit [email or web address]. Please follow instruction sheet, as given. 2. Your physician has requested that you have an echocardiogram. Echocardiography is a painless test that uses sound waves to create images of your heart. It provides your doctor with information about the size and shape of your heart and how well your heart's chambers and valves are working. This procedure takes approximately one hour. There are no restrictions for this procedure.  These will be completed at our [facility] location, [address], [address].  Follow-Up: Dr Stanford Breed recommends that you schedule a follow-up appointment in 6 weeks.  If you need a refill on your cardiac medications before your next appointment, please call your pharmacy.

## 2015-01-25 NOTE — Assessment & Plan Note (Signed)
Management per primary care. 

## 2015-01-26 NOTE — Telephone Encounter (Signed)
Please advise if ok to refill. Medication is listed under a historical provider.

## 2015-02-03 ENCOUNTER — Telehealth: Payer: Self-pay | Admitting: *Deleted

## 2015-02-03 NOTE — Telephone Encounter (Signed)
OK Please let me know what that shows and we will see if it alters anything

## 2015-02-03 NOTE — Telephone Encounter (Signed)
Dr Carlean Purl, This gentleman is on your [facility] schedule for a flex sig 1-16 Monday . This is just an FYI, cardio has set him up for an echo that's pending for dyspnea with unknown etiology . He had an EKG with normal SR 1st degree AV block last cardio note.   Thanks Lelan Pons

## 2015-02-03 NOTE — Telephone Encounter (Signed)
Will do, thanks   Lelan Pons PV

## 2015-02-14 ENCOUNTER — Telehealth: Payer: Self-pay | Admitting: Internal Medicine

## 2015-02-14 NOTE — Telephone Encounter (Signed)
Case cancelled with Linna Hoff at [place]

## 2015-02-15 ENCOUNTER — Telehealth (HOSPITAL_COMMUNITY): Payer: Self-pay | Admitting: *Deleted

## 2015-02-15 NOTE — Telephone Encounter (Signed)
Patient given detailed instructions per Myocardial Perfusion Study Information Sheet for the test on 02/17/15 at 1130. Patient notified to arrive 15 minutes early and that it is imperative to arrive on time for appointment to keep from having the test rescheduled.  If you need to cancel or reschedule your appointment, please call the office within 24 hours of your appointment. Failure to do so may result in a cancellation of your appointment, and a $50 no show fee. Patient verbalized understanding.Uri Covey, Ranae Palms

## 2015-02-17 ENCOUNTER — Ambulatory Visit (HOSPITAL_BASED_OUTPATIENT_CLINIC_OR_DEPARTMENT_OTHER): Payer: Medicare HMO

## 2015-02-17 ENCOUNTER — Ambulatory Visit (HOSPITAL_COMMUNITY): Payer: Medicare HMO | Attending: Cardiology

## 2015-02-17 ENCOUNTER — Other Ambulatory Visit: Payer: Self-pay

## 2015-02-17 DIAGNOSIS — I071 Rheumatic tricuspid insufficiency: Secondary | ICD-10-CM | POA: Diagnosis not present

## 2015-02-17 DIAGNOSIS — I10 Essential (primary) hypertension: Secondary | ICD-10-CM | POA: Insufficient documentation

## 2015-02-17 DIAGNOSIS — R079 Chest pain, unspecified: Secondary | ICD-10-CM

## 2015-02-17 DIAGNOSIS — E7800 Pure hypercholesterolemia, unspecified: Secondary | ICD-10-CM | POA: Diagnosis not present

## 2015-02-17 DIAGNOSIS — R0602 Shortness of breath: Secondary | ICD-10-CM | POA: Diagnosis not present

## 2015-02-17 DIAGNOSIS — R0600 Dyspnea, unspecified: Secondary | ICD-10-CM | POA: Insufficient documentation

## 2015-02-17 DIAGNOSIS — I517 Cardiomegaly: Secondary | ICD-10-CM | POA: Diagnosis not present

## 2015-02-17 DIAGNOSIS — I351 Nonrheumatic aortic (valve) insufficiency: Secondary | ICD-10-CM | POA: Insufficient documentation

## 2015-02-17 HISTORY — PX: TRANSTHORACIC ECHOCARDIOGRAM: SHX275

## 2015-02-17 LAB — MYOCARDIAL PERFUSION IMAGING
CHL CUP NUCLEAR SRS: 1
CHL CUP NUCLEAR SSS: 3
CHL CUP STRESS STAGE 1 DBP: 95 mmHg
CHL CUP STRESS STAGE 1 SBP: 142 mmHg
CHL CUP STRESS STAGE 1 SPEED: 0 mph
CHL CUP STRESS STAGE 2 GRADE: 0 %
CHL CUP STRESS STAGE 2 HR: 66 {beats}/min
CHL CUP STRESS STAGE 3 SBP: 149 mmHg
CHL CUP STRESS STAGE 3 SPEED: 0 mph
CHL CUP STRESS STAGE 4 GRADE: 0 %
CHL CUP STRESS STAGE 4 SPEED: 0 mph
CHL CUP STRESS STAGE 5 DBP: 69 mmHg
CHL CUP STRESS STAGE 5 GRADE: 0 %
CHL CUP STRESS STAGE 5 HR: 83 {beats}/min
CHL CUP STRESS STAGE 5 SBP: 137 mmHg
CHL CUP STRESS STAGE 5 SPEED: 0 mph
CHL CUP STRESS STAGE 6 SBP: 152 mmHg
CSEPPMHR: 58 %
Estimated workload: 1 METS
LHR: 0.29
LV dias vol: 120 mL
LV sys vol: 46 mL
Peak HR: 82 {beats}/min
Rest HR: 65 {beats}/min
SDS: 2
Stage 1 Grade: 0 %
Stage 1 HR: 66 {beats}/min
Stage 2 Speed: 0 mph
Stage 3 DBP: 94 mmHg
Stage 3 Grade: 0 %
Stage 3 HR: 67 {beats}/min
Stage 4 HR: 82 {beats}/min
Stage 6 DBP: 75 mmHg
Stage 6 Grade: 0 %
Stage 6 HR: 81 {beats}/min
Stage 6 Speed: 0 mph
TID: 1.02

## 2015-02-17 MED ORDER — TECHNETIUM TC 99M SESTAMIBI GENERIC - CARDIOLITE
33.0000 | Freq: Once | INTRAVENOUS | Status: AC | PRN
  Administered 2015-02-17: 33 via INTRAVENOUS

## 2015-02-17 MED ORDER — REGADENOSON 0.4 MG/5ML IV SOLN
0.4000 mg | Freq: Once | INTRAVENOUS | Status: AC
  Administered 2015-02-17: 0.4 mg via INTRAVENOUS

## 2015-02-17 MED ORDER — TECHNETIUM TC 99M SESTAMIBI GENERIC - CARDIOLITE
10.2000 | Freq: Once | INTRAVENOUS | Status: AC | PRN
  Administered 2015-02-17: 10 via INTRAVENOUS

## 2015-02-21 ENCOUNTER — Telehealth: Payer: Self-pay | Admitting: *Deleted

## 2015-02-21 NOTE — Telephone Encounter (Signed)
Okay she should be good to go in that L EC

## 2015-02-21 NOTE — Telephone Encounter (Signed)
Dr Carlean Purl,  follow up on This gentleman's echo-- had his echo 12-30 and his EF was 60-65%, aorta showed sclerosis but no stenosis. He has procedure with you at [facility] 1-16 Monday   Thanks, Lelan Pons PV

## 2015-03-06 ENCOUNTER — Ambulatory Visit (HOSPITAL_COMMUNITY): Admission: RE | Admit: 2015-03-06 | Payer: Medicare HMO | Source: Ambulatory Visit | Admitting: Internal Medicine

## 2015-03-06 ENCOUNTER — Encounter (HOSPITAL_COMMUNITY): Admission: RE | Payer: Self-pay | Source: Ambulatory Visit

## 2015-03-06 SURGERY — SIGMOIDOSCOPY, FLEXIBLE
Anesthesia: Moderate Sedation

## 2015-03-17 ENCOUNTER — Other Ambulatory Visit: Payer: Self-pay | Admitting: Endocrinology

## 2015-03-17 NOTE — Telephone Encounter (Signed)
Please advise if ok to refill. Medication is listed under a historical provider.

## 2015-04-04 DIAGNOSIS — H35363 Drusen (degenerative) of macula, bilateral: Secondary | ICD-10-CM | POA: Diagnosis not present

## 2015-04-04 DIAGNOSIS — H25012 Cortical age-related cataract, left eye: Secondary | ICD-10-CM | POA: Diagnosis not present

## 2015-04-04 DIAGNOSIS — Z961 Presence of intraocular lens: Secondary | ICD-10-CM | POA: Diagnosis not present

## 2015-04-04 DIAGNOSIS — H2512 Age-related nuclear cataract, left eye: Secondary | ICD-10-CM | POA: Diagnosis not present

## 2015-04-04 DIAGNOSIS — H43811 Vitreous degeneration, right eye: Secondary | ICD-10-CM | POA: Diagnosis not present

## 2015-04-04 DIAGNOSIS — H5203 Hypermetropia, bilateral: Secondary | ICD-10-CM | POA: Diagnosis not present

## 2015-04-25 ENCOUNTER — Other Ambulatory Visit: Payer: Self-pay | Admitting: Endocrinology

## 2015-05-26 ENCOUNTER — Ambulatory Visit (INDEPENDENT_AMBULATORY_CARE_PROVIDER_SITE_OTHER): Payer: Medicare HMO | Admitting: Endocrinology

## 2015-05-26 VITALS — BP 138/87 | HR 75 | Temp 98.6°F | Ht 70.0 in | Wt 257.0 lb

## 2015-05-26 DIAGNOSIS — R69 Illness, unspecified: Secondary | ICD-10-CM | POA: Diagnosis not present

## 2015-05-26 DIAGNOSIS — R682 Dry mouth, unspecified: Secondary | ICD-10-CM | POA: Diagnosis not present

## 2015-05-26 DIAGNOSIS — M2550 Pain in unspecified joint: Secondary | ICD-10-CM | POA: Insufficient documentation

## 2015-05-26 DIAGNOSIS — D500 Iron deficiency anemia secondary to blood loss (chronic): Secondary | ICD-10-CM | POA: Diagnosis not present

## 2015-05-26 DIAGNOSIS — F0390 Unspecified dementia without behavioral disturbance: Secondary | ICD-10-CM | POA: Diagnosis not present

## 2015-05-26 LAB — BASIC METABOLIC PANEL
BUN: 16 mg/dL (ref 6–23)
CALCIUM: 10 mg/dL (ref 8.4–10.5)
CO2: 29 meq/L (ref 19–32)
CREATININE: 1.31 mg/dL (ref 0.40–1.50)
Chloride: 99 mEq/L (ref 96–112)
GFR: 67.59 mL/min (ref >60.00)
GLUCOSE: 126 mg/dL — AB (ref 70–99)
Potassium: 4.2 mEq/L (ref 3.5–5.1)
SODIUM: 134 meq/L — AB (ref 135–145)

## 2015-05-26 LAB — CBC WITH DIFFERENTIAL/PLATELET
Basophils Absolute: 0 10*3/uL (ref 0.0–0.1)
Basophils Relative: 0.4 % (ref 0.0–3.0)
EOS PCT: 3.3 % (ref 0.0–5.0)
Eosinophils Absolute: 0.2 10*3/uL (ref 0.0–0.7)
HEMATOCRIT: 41.4 % (ref 39.0–52.0)
HEMOGLOBIN: 13.7 g/dL (ref 13.0–17.0)
Lymphocytes Relative: 29 % (ref 12.0–46.0)
Lymphs Abs: 1.9 10*3/uL (ref 0.7–4.0)
MCHC: 33 g/dL (ref 30.0–36.0)
MCV: 84.9 fl (ref 78.0–100.0)
MONOS PCT: 11.2 % (ref 3.0–12.0)
Monocytes Absolute: 0.7 10*3/uL (ref 0.1–1.0)
Neutro Abs: 3.7 10*3/uL (ref 1.4–7.7)
Neutrophils Relative %: 56.1 % (ref 43.0–77.0)
Platelets: 251 10*3/uL (ref 150.0–400.0)
RBC: 4.88 Mil/uL (ref 4.22–5.81)
RDW: 15.2 % (ref 11.5–15.5)
WBC: 6.5 10*3/uL (ref 4.0–10.5)

## 2015-05-26 LAB — TSH: TSH: 1.95 u[IU]/mL (ref 0.35–4.50)

## 2015-05-26 LAB — SEDIMENTATION RATE: SED RATE: 33 mm/h — AB (ref 0–22)

## 2015-05-26 LAB — IBC PANEL
Iron: 51 ug/dL (ref 42–165)
SATURATION RATIOS: 12.9 % — AB (ref 20.0–50.0)
Transferrin: 282 mg/dL (ref 212.0–360.0)

## 2015-05-26 MED ORDER — TRAMADOL HCL 50 MG PO TABS: 50.0000 mg | ORAL_TABLET | Freq: Three times a day (TID) | ORAL | Status: DC | PRN

## 2015-05-26 NOTE — Patient Instructions (Addendum)
blood tests are requested for you today.  We'll let you know about the results. There are products you can for this symptom, but sugar-free lemon candy is good to try, also.

## 2015-05-26 NOTE — Progress Notes (Signed)
Subjective:    Patient ID: Miguel Blake, male    DOB: [birth year], 82 y.o.   MRN: [id number]  HPI Pt states few mos of moderate dryness of the mouth and assoc dry eyes.   Past Medical History  Diagnosis Date  . DEMENTIA 09/15/2006  . HYPERTENSION 09/15/2006  . OSTEOARTHRITIS 09/15/2006  . BENIGN PROSTATIC HYPERTROPHY, WITH OBSTRUCTION 05/11/2008  . 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 HYPERCHOLESTEROLEMIA 05/11/2008  . Increased prostate specific antigen (PSA) velocity   . Non-cardiogenic pulmonary edema   . Anxiety   . Cancer ([place])     prostate C  . GERD (gastroesophageal reflux disease)   . Hx of adenomatous colonic polyps 09/05/2014    Past Surgical History  Procedure Laterality Date  . Exercise stress cardiolite  01/17/2003  . Doppler echocardiography  08/27/2001  . Electrocardiogram  11/21/2005  . Appendectomy  unsure of date  . Cataract extraction      right eye    Social History   Social History  . Marital Status: Widowed    Spouse Name: N/A  . Number of Children: 4  . Years of Education: 10th grade   Occupational History  . 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     Social History Main Topics  . Smoking status: Former Smoker -- 1.00 packs/day for 20 years    Types: Cigarettes    Quit date: 02/18/1973  . Smokeless tobacco: Never Used  . Alcohol Use: No     Comment: Quit drinking-heavy drinker  . Drug Use: No  . Sexual Activity: Not on file   Other Topics Concern  . Not on file   Social History Narrative   Retired Financial controller    Current Outpatient Prescriptions on File Prior to Visit  Medication Sig Dispense Refill  . amLODipine (NORVASC) 5 MG tablet Take 1 tablet (5 mg total) by mouth daily. 90 tablet 3  . citalopram (CELEXA) 20 MG tablet TAKE 1 TABLET BY MOUTH DAILY 90 tablet 0  . ezetimibe (ZETIA) 10 MG tablet Take 1 tablet (10 mg total) by mouth daily. 90 tablet 3  . finasteride  (PROSCAR) 5 MG tablet TAKE 1 TABLET BY MOUTH DAILY 90 tablet 0  . losartan-hydrochlorothiazide (HYZAAR) 100-25 MG tablet TAKE 1 TABLET BY MOUTH EVERY DAY 90 tablet 0  . omeprazole (PRILOSEC) 20 MG capsule Take 1 capsule (20 mg total) by mouth daily. 30 capsule 11  . tamsulosin (FLOMAX) 0.4 MG CAPS capsule TAKE ONE CAPSULE BY MOUTH EVERY DAY 90 capsule 0   No current facility-administered medications on file prior to visit.    Allergies  Allergen Reactions  . Enalapril Maleate     Unsure of reaction  . Fluvastatin Sodium     REACTION: Myalgia  . Penicillins     "made me feel like I was going to die"    Family History  Problem Relation Age of Onset  . Prostate cancer Father   . Colon cancer Neg Hx   . Colon polyps Neg Hx   . Esophageal cancer Neg Hx   . Heart disease Neg Hx   . Diabetes Neg Hx   . Gallbladder disease Neg Hx   . Kidney disease Neg Hx   . Rectal cancer Neg Hx   . Stomach cancer Neg Hx   . Hypertension Mother     BP 138/87 mmHg  Pulse 75  Temp(Src) 98.6 F (37 C) (Oral)  Ht 5\' 10"  (1.778 m)  Wt 257 lb (116.574 kg)  BMI 36.88 kg/m2  SpO2 95%  Review of Systems No change in chronic arthralgias.  Denies n/v/headache.  Pt says his memory is good.      Objective:   Physical Exam VITAL SIGNS:  See vs page GENERAL: no distress head: no deformity eyes: no periorbital swelling, no proptosis external nose and ears are normal mouth: no lesion seen.  Does not seem excessively dry.     Lab Results  Component Value Date   CREATININE 1.31 05/26/2015   BUN 16 05/26/2015   NA 134* 05/26/2015   K 4.2 05/26/2015   CL 99 05/26/2015   CO2 29 05/26/2015   Random glucose=126    Assessment & Plan:  Dry mouth, new, uncertain etiology.  Hyperglycemia: stable.  i advised diet.  Renal insuff: stable.  We'll follow.  Arthralgias: resume ultram.    Patient is advised the following: Patient Instructions  blood tests are requested for you today.  We'll let you  know about the results. There are products you can for this symptom, but sugar-free lemon candy is good to try, also.

## 2015-06-14 ENCOUNTER — Ambulatory Visit: Payer: Commercial Managed Care - HMO | Admitting: Endocrinology

## 2015-06-20 ENCOUNTER — Other Ambulatory Visit: Payer: Self-pay | Admitting: Endocrinology

## 2015-07-12 ENCOUNTER — Telehealth: Payer: Self-pay | Admitting: Endocrinology

## 2015-07-12 DIAGNOSIS — Z789 Other specified health status: Secondary | ICD-10-CM | POA: Insufficient documentation

## 2015-07-12 NOTE — Telephone Encounter (Signed)
Pt needs a call back did not want to specify

## 2015-07-12 NOTE — Addendum Note (Signed)
Addended by: Renato Shin on: 07/12/2015 03:22 PM   Modules accepted: Orders

## 2015-07-12 NOTE — Telephone Encounter (Signed)
i did referral 

## 2015-07-12 NOTE — Telephone Encounter (Signed)
I contacted the pt and advised we has placed the referral. Pt voiced understanding.

## 2015-07-12 NOTE — Telephone Encounter (Signed)
I contacted the pt. Pt stated he would like to be circumcised and wanted to know where he should be referred to to have this completed? Please advise, Thanks!

## 2015-07-19 ENCOUNTER — Telehealth: Payer: Self-pay | Admitting: Endocrinology

## 2015-07-19 NOTE — Telephone Encounter (Signed)
Pt asking for a call back please no specifics given

## 2015-07-19 NOTE — Telephone Encounter (Signed)
I contacted the pt. Pt wanted to verify if the urology referral had been placed. Pt advised referral has been placed and once we receive confirmation back from [facility] we will be able to schedule. Pt voiced understanding.

## 2015-07-20 ENCOUNTER — Other Ambulatory Visit: Payer: Self-pay | Admitting: Endocrinology

## 2015-08-21 DIAGNOSIS — C61 Malignant neoplasm of prostate: Secondary | ICD-10-CM | POA: Diagnosis not present

## 2015-08-21 DIAGNOSIS — N471 Phimosis: Secondary | ICD-10-CM | POA: Diagnosis not present

## 2015-08-24 ENCOUNTER — Other Ambulatory Visit: Payer: Self-pay | Admitting: Urology

## 2015-08-25 ENCOUNTER — Encounter (HOSPITAL_BASED_OUTPATIENT_CLINIC_OR_DEPARTMENT_OTHER): Payer: Self-pay | Admitting: *Deleted

## 2015-08-28 ENCOUNTER — Encounter (HOSPITAL_BASED_OUTPATIENT_CLINIC_OR_DEPARTMENT_OTHER): Payer: Self-pay | Admitting: *Deleted

## 2015-08-28 NOTE — Progress Notes (Signed)
NPO AFTER MN.  ARRIVE AT 1015.  NEEDS ISTAT.  CURRENT EKG IN CHART AND EPIC.

## 2015-08-30 ENCOUNTER — Ambulatory Visit (HOSPITAL_BASED_OUTPATIENT_CLINIC_OR_DEPARTMENT_OTHER): Payer: Medicare HMO | Admitting: Anesthesiology

## 2015-08-30 ENCOUNTER — Ambulatory Visit (HOSPITAL_BASED_OUTPATIENT_CLINIC_OR_DEPARTMENT_OTHER)
Admission: RE | Admit: 2015-08-30 | Discharge: 2015-08-30 | Disposition: A | Discharge status: Home / Self Care | Payer: Medicare HMO | Source: Ambulatory Visit | Attending: Urology | Admitting: Urology

## 2015-08-30 ENCOUNTER — Encounter (HOSPITAL_BASED_OUTPATIENT_CLINIC_OR_DEPARTMENT_OTHER): Payer: Self-pay | Admitting: Anesthesiology

## 2015-08-30 ENCOUNTER — Encounter (HOSPITAL_BASED_OUTPATIENT_CLINIC_OR_DEPARTMENT_OTHER)
Admission: RE | Disposition: A | Discharge status: Home / Self Care | Payer: Self-pay | Source: Ambulatory Visit | Attending: Urology

## 2015-08-30 DIAGNOSIS — C61 Malignant neoplasm of prostate: Secondary | ICD-10-CM | POA: Insufficient documentation

## 2015-08-30 DIAGNOSIS — K219 Gastro-esophageal reflux disease without esophagitis: Secondary | ICD-10-CM | POA: Insufficient documentation

## 2015-08-30 DIAGNOSIS — Z7982 Long term (current) use of aspirin: Secondary | ICD-10-CM | POA: Insufficient documentation

## 2015-08-30 DIAGNOSIS — N138 Other obstructive and reflux uropathy: Secondary | ICD-10-CM | POA: Insufficient documentation

## 2015-08-30 DIAGNOSIS — Z79899 Other long term (current) drug therapy: Secondary | ICD-10-CM | POA: Insufficient documentation

## 2015-08-30 DIAGNOSIS — N471 Phimosis: Secondary | ICD-10-CM | POA: Diagnosis not present

## 2015-08-30 DIAGNOSIS — Z87891 Personal history of nicotine dependence: Secondary | ICD-10-CM | POA: Diagnosis not present

## 2015-08-30 DIAGNOSIS — E7800 Pure hypercholesterolemia, unspecified: Secondary | ICD-10-CM | POA: Insufficient documentation

## 2015-08-30 DIAGNOSIS — I10 Essential (primary) hypertension: Secondary | ICD-10-CM | POA: Diagnosis not present

## 2015-08-30 DIAGNOSIS — N401 Enlarged prostate with lower urinary tract symptoms: Secondary | ICD-10-CM | POA: Insufficient documentation

## 2015-08-30 DIAGNOSIS — N5201 Erectile dysfunction due to arterial insufficiency: Secondary | ICD-10-CM | POA: Insufficient documentation

## 2015-08-30 HISTORY — DX: Hyperlipidemia, unspecified: E78.5

## 2015-08-30 HISTORY — DX: Depression, unspecified: F32.A

## 2015-08-30 HISTORY — PX: CIRCUMCISION: SHX1350

## 2015-08-30 HISTORY — DX: Personal history of other diseases of the respiratory system: Z87.09

## 2015-08-30 HISTORY — DX: Other forms of dyspnea: R06.09

## 2015-08-30 HISTORY — DX: Cyst of kidney, acquired: N28.1

## 2015-08-30 HISTORY — DX: Nocturia: R35.1

## 2015-08-30 HISTORY — DX: Atrioventricular block, first degree: I44.0

## 2015-08-30 HISTORY — DX: Dyspnea, unspecified: R06.00

## 2015-08-30 HISTORY — DX: Diverticulosis of large intestine without perforation or abscess without bleeding: K57.30

## 2015-08-30 HISTORY — DX: Malignant neoplasm of prostate: C61

## 2015-08-30 HISTORY — DX: Disorder of kidney and ureter, unspecified: N28.9

## 2015-08-30 HISTORY — DX: Major depressive disorder, single episode, unspecified: F32.9

## 2015-08-30 HISTORY — DX: Essential (primary) hypertension: I10

## 2015-08-30 HISTORY — DX: Male erectile dysfunction, unspecified: N52.9

## 2015-08-30 HISTORY — DX: Unspecified osteoarthritis, unspecified site: M19.90

## 2015-08-30 HISTORY — DX: Phimosis: N47.1

## 2015-08-30 LAB — POCT I-STAT, CHEM 8
BUN: 19 mg/dL (ref 6–20)
CREATININE: 1.2 mg/dL (ref 0.61–1.24)
Calcium, Ion: 1.25 mmol/L — ABNORMAL HIGH (ref 1.12–1.23)
Chloride: 101 mmol/L (ref 101–111)
Glucose, Bld: 124 mg/dL — ABNORMAL HIGH (ref 65–99)
HEMATOCRIT: 40 % (ref 39.0–52.0)
HEMOGLOBIN: 13.6 g/dL (ref 13.0–17.0)
POTASSIUM: 3.1 mmol/L — AB (ref 3.5–5.1)
Sodium: 141 mmol/L (ref 135–145)
TCO2: 26 mmol/L (ref 0–100)

## 2015-08-30 SURGERY — CIRCUMCISION, ADULT
Anesthesia: Monitor Anesthesia Care | Site: Penis

## 2015-08-30 MED ORDER — KETAMINE HCL 10 MG/ML IJ SOLN
INTRAMUSCULAR | Status: AC
  Filled 2015-08-30: qty 1

## 2015-08-30 MED ORDER — CEFAZOLIN SODIUM-DEXTROSE 2-4 GM/100ML-% IV SOLN
2.0000 g | Freq: Once | INTRAVENOUS | Status: AC
  Administered 2015-08-30: 2 g via INTRAVENOUS
  Filled 2015-08-30: qty 100

## 2015-08-30 MED ORDER — FENTANYL CITRATE (PF) 100 MCG/2ML IJ SOLN
INTRAMUSCULAR | Status: DC | PRN
  Administered 2015-08-30: 50 ug via INTRAVENOUS

## 2015-08-30 MED ORDER — PROPOFOL 500 MG/50ML IV EMUL
INTRAVENOUS | Status: DC | PRN
  Administered 2015-08-30: 200 ug/kg/min via INTRAVENOUS

## 2015-08-30 MED ORDER — CEFAZOLIN SODIUM-DEXTROSE 2-4 GM/100ML-% IV SOLN
INTRAVENOUS | Status: AC
  Filled 2015-08-30: qty 100

## 2015-08-30 MED ORDER — ONDANSETRON HCL 4 MG/2ML IJ SOLN
4.0000 mg | Freq: Once | INTRAMUSCULAR | Status: DC | PRN
  Filled 2015-08-30: qty 2

## 2015-08-30 MED ORDER — FENTANYL CITRATE (PF) 100 MCG/2ML IJ SOLN
INTRAMUSCULAR | Status: AC
  Filled 2015-08-30: qty 2

## 2015-08-30 MED ORDER — SODIUM CHLORIDE 0.9 % IV SOLN
250.0000 mg | INTRAVENOUS | Status: DC | PRN
  Administered 2015-08-30: 10 ug/kg/min via INTRAVENOUS

## 2015-08-30 MED ORDER — LIDOCAINE HCL 1 % IJ SOLN
INTRAMUSCULAR | Status: DC | PRN
  Administered 2015-08-30: 9.5 mL

## 2015-08-30 MED ORDER — LIDOCAINE HCL (CARDIAC) 20 MG/ML IV SOLN
INTRAVENOUS | Status: DC | PRN
  Administered 2015-08-30: 50 mg via INTRAVENOUS

## 2015-08-30 MED ORDER — LACTATED RINGERS IV SOLN
INTRAVENOUS | Status: DC
  Administered 2015-08-30 (×2): via INTRAVENOUS
  Filled 2015-08-30: qty 1000

## 2015-08-30 MED ORDER — PROPOFOL 10 MG/ML IV BOLUS
INTRAVENOUS | Status: AC
  Filled 2015-08-30: qty 20

## 2015-08-30 MED ORDER — BUPIVACAINE HCL (PF) 0.25 % IJ SOLN
INTRAMUSCULAR | Status: DC | PRN
  Administered 2015-08-30: 9.5 mL

## 2015-08-30 MED ORDER — LIDOCAINE HCL (CARDIAC) 20 MG/ML IV SOLN
INTRAVENOUS | Status: AC
  Filled 2015-08-30: qty 5

## 2015-08-30 MED ORDER — FENTANYL CITRATE (PF) 100 MCG/2ML IJ SOLN
25.0000 ug | INTRAMUSCULAR | Status: DC | PRN
  Filled 2015-08-30: qty 1

## 2015-08-30 MED ORDER — HYDROCODONE-ACETAMINOPHEN 5-325 MG PO TABS: 1.0000 | ORAL_TABLET | Freq: Four times a day (QID) | ORAL | Status: DC | PRN

## 2015-08-30 SURGICAL SUPPLY — 37 items
BANDAGE COBAN STERILE 2 (GAUZE/BANDAGES/DRESSINGS) ×2 IMPLANT
BLADE SURG 15 STRL LF DISP TIS (BLADE) ×1 IMPLANT
BLADE SURG 15 STRL SS (BLADE) ×2
BNDG CONFORM 2 STRL LF (GAUZE/BANDAGES/DRESSINGS) ×1 IMPLANT
COVER BACK TABLE 60X90IN (DRAPES) ×2 IMPLANT
COVER MAYO STAND STRL (DRAPES) ×2 IMPLANT
DRAPE LAPAROTOMY 100X72 PEDS (DRAPES) ×2 IMPLANT
DRSG TEGADERM 2-3/8X2-3/4 SM (GAUZE/BANDAGES/DRESSINGS) IMPLANT
ELECT NDL TIP 2.8 STRL (NEEDLE) ×1 IMPLANT
ELECT NEEDLE TIP 2.8 STRL (NEEDLE) ×2 IMPLANT
ELECT REM PT RETURN 9FT ADLT (ELECTROSURGICAL) ×2
ELECTRODE REM PT RTRN 9FT ADLT (ELECTROSURGICAL) ×1 IMPLANT
GAUZE PETROLATUM 1 X8 (GAUZE/BANDAGES/DRESSINGS) ×2 IMPLANT
GAUZE SPONGE 4X4 12PLY STRL LF (GAUZE/BANDAGES/DRESSINGS) IMPLANT
GAUZE SPONGE 4X4 16PLY XRAY LF (GAUZE/BANDAGES/DRESSINGS) ×1 IMPLANT
GAUZE VASELINE 1X8 (GAUZE/BANDAGES/DRESSINGS) ×1 IMPLANT
GLOVE BIO SURGEON STRL SZ7.5 (GLOVE) ×3 IMPLANT
GLOVE BIOGEL PI IND STRL 7.5 (GLOVE) IMPLANT
GLOVE BIOGEL PI INDICATOR 7.5 (GLOVE) ×3
GOWN STRL REUS W/ TWL LRG LVL3 (GOWN DISPOSABLE) IMPLANT
GOWN STRL REUS W/TWL LRG LVL3 (GOWN DISPOSABLE) ×3 IMPLANT
GOWN STRL REUS W/TWL XL LVL3 (GOWN DISPOSABLE) ×1 IMPLANT
KIT ROOM TURNOVER WOR (KITS) ×2 IMPLANT
MANIFOLD NEPTUNE II (INSTRUMENTS) IMPLANT
NDL HYPO 25X1 1.5 SAFETY (NEEDLE) ×1 IMPLANT
NEEDLE HYPO 25X1 1.5 SAFETY (NEEDLE) ×2 IMPLANT
NS IRRIG 500ML POUR BTL (IV SOLUTION) ×2 IMPLANT
PACK BASIN DAY SURGERY FS (CUSTOM PROCEDURE TRAY) ×2 IMPLANT
PENCIL BUTTON HOLSTER BLD 10FT (ELECTRODE) ×2 IMPLANT
SUT VIC AB 5-0 P-3 18X BRD (SUTURE) ×1 IMPLANT
SUT VIC AB 5-0 P3 18 (SUTURE) ×2
SUT VICRYL 4-0 PS2 18IN ABS (SUTURE) ×6 IMPLANT
SYR CONTROL 10ML LL (SYRINGE) ×2 IMPLANT
TOWEL OR 17X24 6PK STRL BLUE (TOWEL DISPOSABLE) ×4 IMPLANT
TRAY DSU PREP LF (CUSTOM PROCEDURE TRAY) ×2 IMPLANT
TUBE CONNECTING 12X1/4 (SUCTIONS) IMPLANT
WATER STERILE IRR 500ML POUR (IV SOLUTION) IMPLANT

## 2015-08-30 NOTE — Op Note (Signed)
Pre-operative diagnosis: phimosis  Postoperative diagnosis: phimosis  Procedure: Circumcision  Surgeon: Rana Snare, MD  Resident: Virginia Crews, MD  Anesthesia: General  Complications: None  EBL: 95ml  Specimens: Foreskin, not sent for pathology  Indication: Miguel Blake is a 82 y.o. male who was diagnosed with severe phimosis.  He elected to proceed with circumcision for treatment. The potential risks, complications, alternative options, and expected recovery process was discussed in detail and informed consent was obtained.  Description of procedure: The patient was taken to the operating room and MAC was administered. He was given preoperative antibiotics, placed in the supine position, and his genitalia was prepped and draped in the usual sterile fashion. Next, a preoperative timeout was performed.  The penis was examined and a penile block was performed with quarter percent bupivacaine without epinephrine. Due to inability to reduce the foreskin we performed a dorsal slit by clamping the dorsal foreskin and then opening this with Bovie electrocautery. We then prepped the glans with Betadine.  Appropriate incision sites were marked circumferentially on the inner and outer foreskin. These incision sites were then incised with the knife circumferentially allowing the skin to be separated distally and approximately and isolating the foreskin. Hemostats were then placed in the dorsal aspect of the foreskin and Bovie electrocautery was used to create an opening connecting the proximal and distal incision sites.  The entire foreskin was then excised circumferentially using cautery dissection as necessary. Once the foreskin was removed, the underlying penile tissue and dark his fascia was examined and hemostasis was achieved with cautery as necessary. The proximal and distal incision sites were then reapproximated utilizing 3-0 Vicryl suture. 4 interrupted sutures were placed dorsally,  ventrally, and on either side. The intervening tissue between each quadrant was then reapproximated with inetrrupted 4-0 Vicryl suture.  Vaseline gauze was then placed over the circumcision site and the penis was wrapped with Coband. The patient tolerated the procedure well and without complications. He was able to be awakened and transferred to the recovery unit in satisfactory condition.   Attestation: Dr. Risa Grill was present and scrubbed for the entirety of the procedure.

## 2015-08-30 NOTE — H&P (Signed)
Office Visit Report     08/21/2015   --------------------------------------------------------------------------------   Miguel Flatten. Blake  MRN: [id number]  PRIMARY CARE:  Miguel Pi. Loanne Drilling, MD  DOB: Jul 06, 1932, 82 year old Male old Male  REFERRING:  Miguel A. Loanne Drilling, MD  SSN: [id number]  PROVIDER:  Rana Blake, M.D.    LOCATION:  [facility] [phone]   --------------------------------------------------------------------------------   CC/HPI: I have prostate cancer.    CC: I have prostate cancer (treatment).  HPI: Miguel Blake is an 82 year-old male established patient who is here for prostate cancer which has been treated.  He is participating in active surveillance. He has been under active surveillance for 08/20/2001. He did not have surgery. He did not receive radiation therapy for his cancer. He has not undergone Hormonal Therapy for treatment. He did not undergo chemotherapy for treatment of his prostate cancer.   His PSA blood tests have not been low since his prostate cancer treatment was started.   He does have problems with erections. He does not have urinary incontinence. He does not have an abnormal sensation when needing to urinate.   Miguel Blake ppresents today for routine follow-up. He does report some increased difficulty retracting his foreskin. His voiding situation is otherwise relatively stable. He is due for PSA testing. He has been active surveillance now for 14 years. He remains on tamsulosin and finasteride.     CC: I am here to discuss a circumcision.  HPI: He has had difficulties retracting his foreskin.   He has had cracking of his penile skin.   He would like to be circumcised.     ALLERGIES: No Allergies    MEDICATIONS: AmLODIPine Besylate 5 MG Oral Tablet Oral  Citalopram Hydrobromide 20 MG Oral Tablet Oral  Finasteride 5 MG Oral Tablet 0 Oral  Losartan Potassium-HCTZ 100-25 MG Oral Tablet Oral  Omeprazole 20 MG Oral Capsule Delayed Release Oral   Tamsulosin HCl - 0.4 MG Oral Capsule 0 Oral  Zetia 10 MG Oral Tablet Oral     GU PSH: Hernia Repair - 2008      PSH Notes: Appendectomy, Hernia Repair   NON-GU PSH: Appendectomy - 2008    GU PMH: ED, arterial insufficiency, Erectile dysfunction due to arterial insufficiency - 12/27/2014 Prostate Cancer, Adenocarcinoma of prostate - 12/27/2014 BPH w/LUTS, Benign prostatic hyperplasia with urinary obstruction - 06/27/2014 History of prostate cancer, Prostate Cancer - 2014      PMH Notes: Miguel Blake re-presented to our practice in the fall of 2015 after a 2-1/2 year hiatus from our practice. His urologic history dates back to 2003. At that time, I diagnosed him with a low-risk clinical stage T1c adenocarcinoma of the prostate. At that point he was in his upper sixties and we thought he would be a good candidate for active surveillance. The patient had repeat biopsy in 2004 as well as 2006. He always had low-volume Gleason 6 cancer and is felt to have favorable risk disease. There was never any evidence of any pathologic progression. The patient was initially on Flomax, but also had finasteride added in the summer of 2006. Previous biopsies showed a prostate of significant size, around 90 grams.    I saw Miguel Blake back in 2013. At that time, clinically he was doing fairly well. PSA had actually decreased on the finasteride and been relatively stable in the 3-4 range. It appears he did have a PSA in September of 2014 at Dr Cordelia Blake office, which was 3.7, and that is certainly  very encouraging.    We started him back on finasteride in addition to his tamsulosin. A repeat PSA was performed at that time and noted to be approximately 11.0.   We felt that given his known history of prostate cancer and age 83 that it was appropriate to continue to monitor things and check his PSA response to finasteride before deciding if any other treatment and/or assessment was indicated.    In May 2016 PSA was  reassessed and was down to 5.7. We felt that was an appropriate drop on the finasteride.   He has no new complaints or concerns today. Postvoid residual is minimal. Urinalysis is unremarkable.    PSA drawn in October 2016 and outside lab was down further to 4.1.     NON-GU PMH: Encounter for general adult medical examination without abnormal findings, Encounter for preventive health examination - 12/27/2014 Personal history of other diseases of the circulatory system, History of hypertension - 2014 Personal history of other endocrine, nutritional and metabolic disease, History of hypercholesterolemia - 2014    FAMILY HISTORY: Family Health Status Number - Runs In Family Prostate Cancer - Father   SOCIAL HISTORY: Marital Status: Widowed Current Smoking Status: Patient does not smoke anymore.  Has never drank.  Drinks 1 caffeinated drink per day.     Notes: Current every day smoker, Alcohol Use, Occupation:, Caffeine Use, Death In The Family Father, Tobacco Use   REVIEW OF SYSTEMS:    GU Review Male:   Patient denies frequent urination, hard to postpone urination, burning/ pain with urination, get up at night to urinate, leakage of urine, stream starts and stops, trouble starting your stream, have to strain to urinate , erection problems, and penile pain.  Gastrointestinal (Upper):   Patient denies nausea, vomiting, and indigestion/ heartburn.  Gastrointestinal (Lower):   Patient denies diarrhea and constipation.  Constitutional:   Patient denies fever, night sweats, weight loss, and fatigue.  Skin:   Patient denies skin rash/ lesion and itching.  Eyes:   Patient denies blurred vision and double vision.  Ears/ Nose/ Throat:   Patient denies sore throat and sinus problems.  Hematologic/Lymphatic:   Patient denies swollen glands and easy bruising.  Cardiovascular:   Patient denies leg swelling and chest pains.  Respiratory:   Patient denies cough and shortness of breath.  Endocrine:    Patient denies excessive thirst.  Musculoskeletal:   Patient denies back pain and joint pain.  Neurological:   Patient denies headaches and dizziness.  Psychologic:   Patient denies depression and anxiety.   VITAL SIGNS:      08/21/2015 02:02 PM  BP 115/66 mmHg  Pulse 97 /min  Temperature 98.0 F / 37 C   GU PHYSICAL EXAMINATION:    Penis: Penis uncircumcised, phimosis. No foreskin warts, no cracks. No dorsal peyronie's plaques, no left corporal peyronie's plaques, no right corporal peyronie's plaques, no scarring, no shaft warts. No balanitis, no meatal stenosis.   Prostate: Prostate about 30 grams. Left lobe normal consistency, right lobe normal consistency. Symmetrical lobes. No prostate nodule. Left lobe no tenderness, right lobe no tenderness.    MULTI-SYSTEM PHYSICAL EXAMINATION:    Constitutional: Well-nourished. No physical deformities. Normally developed. Good grooming.   Respiratory: No labored breathing, no use of accessory muscles.   Cardiovascular: Normal temperature, normal extremity pulses, no swelling, no varicosities.  Neurologic / Psychiatric: Oriented to time, oriented to place, oriented to person. No depression, no anxiety, no agitation.  Gastrointestinal: No mass, no tenderness, no  rigidity, non obese abdomen.  Musculoskeletal: Normal gait and station of head and neck.     PAST DATA REVIEWED:  Source Of History:  Patient   PROCEDURES:          Urinalysis w/Scope - 81001 Dipstick Dipstick Cont'd Micro  Specimen: Voided Bilirubin: Neg WBC/hpf: 0-5/hpf  Color: Amber Ketones: Neg RBC/hpf: 3-10/hpf  Appearance: Clear Blood: Trace Intact Bacteria: Few (10-25/hpf)  Specific Gravity: 1.020 Protein: Trace Cystals: Amorph Urates  pH: 6.0 Urobilinogen: 0.2 Casts: NS (Not Seen)  Glucose: Neg Nitrites: Neg Trichomonas: Not Present    Leukocyte Esterase: Trace Mucous: Present      Epithelial Cells: 0-5/hpf      Yeast: NS (Not Seen)      Sperm: Not Present     ASSESSMENT:      ICD-10 Details  1 GU:   Prostate Cancer - C61   2   Phimosis - N47.1   3 NON-GU:   Encounter for routine and ritual male circumcision - Z41.2    PLAN:           Orders Labs PSA          Schedule Return Visit: 1 Month - Schedule Surgery          Document Letter(s):  Created for Patient: Clinical Summary         Notes:   Miguel Blake Has had low risk prostate cancer now 13+ years.Continued observation is indicated. No evidence of clinical progression. He is due for PSA testing today. He says significant difficulty with phimosis and clearly has severe phimosis at this time. I'm unable to assess for balanitis but there does not appear to be an active infection. I'm unable to retract his foreskin. We did discuss the pros and cons of circumcision and he would like to proceed with that. We can do this under IV sedation with penile block.     Signed by Miguel Blake, M.D. on 08/21/15 at 2:36 PM (EDT)     The information contained in this medical record document is considered private and confidential patient information. This information can only be used for the medical diagnosis and/or medical services that are being provided by the patient's selected caregivers. This information can only be distributed outside of the patient's care if the patient agrees and signs waivers of authorization for this information to be sent to an outside source or route.

## 2015-08-30 NOTE — Anesthesia Procedure Notes (Signed)
Procedure Name: MAC Date/Time: 08/30/2015 11:37 AM Performed by: Wanita Chamberlain Pre-anesthesia Checklist: Patient identified, Timeout performed, Emergency Drugs available, Suction available and Patient being monitored Patient Re-evaluated:Patient Re-evaluated prior to inductionOxygen Delivery Method: Nasal cannula Intubation Type: IV induction Placement Confirmation: positive ETCO2 and breath sounds checked- equal and bilateral Dental Injury: Teeth and Oropharynx as per pre-operative assessment

## 2015-08-30 NOTE — Transfer of Care (Signed)
Immediate Anesthesia Transfer of Care Note  Patient: Miguel Blake  Procedure(s) Performed: Procedure(s): CIRCUMCISION ADULT (N/A)  Patient Location: PACU  Anesthesia Type:MAC  Level of Consciousness: sedated and patient cooperative  Airway & Oxygen Therapy: Patient Spontanous Breathing and Patient connected to nasal cannula oxygen  Post-op Assessment: Report given to RN and Post -op Vital signs reviewed and stable  Post vital signs: Reviewed and stable  Last Vitals:  Filed Vitals:   08/30/15 1009  BP: 141/80  Pulse: 75  Temp: 36.9 C  Resp: 20    Last Pain: There were no vitals filed for this visit.    Patients Stated Pain Goal: 5 ([id number])  Complications: No apparent anesthesia complications

## 2015-08-30 NOTE — Anesthesia Preprocedure Evaluation (Addendum)
Anesthesia Evaluation  Patient identified by MRN, date of birth, ID band Patient awake    Reviewed: Allergy & Precautions, NPO status , Patient's Chart, lab work & pertinent test results  Airway Mallampati: II  TM Distance: >3 FB Neck ROM: Full    Dental no notable dental hx.    Pulmonary shortness of breath, former smoker,    Pulmonary exam normal breath sounds clear to auscultation       Cardiovascular hypertension, Pt. on medications  Rhythm:Regular Rate:Normal  02-17-15 2 D Echo Left ventricle: The cavity size was normal. There was mild focal  basal hypertrophy of the septum. Systolic function was normal.  The estimated ejection fraction was in the range of 60% to 65%.  Wall motion was normal; there were no regional wall motion  abnormalities. Doppler parameters are consistent with abnormal  left ventricular relaxation (grade 1 diastolic dysfunction). Mild AR & TR   Neuro/Psych  Headaches, Anxiety Depression    GI/Hepatic GERD  ,  Endo/Other    Renal/GU      Musculoskeletal  (+) Arthritis ,   Abdominal   Peds  Hematology   Anesthesia Other Findings   Reproductive/Obstetrics                            Anesthesia Physical Anesthesia Plan  ASA: II  Anesthesia Plan: MAC   Post-op Pain Management:    Induction: Intravenous  Airway Management Planned:   Additional Equipment:   Intra-op Plan:   Post-operative Plan:   Informed Consent: I have reviewed the patients History and Physical, chart, labs and discussed the procedure including the risks, benefits and alternatives for the proposed anesthesia with the patient or authorized representative who has indicated his/her understanding and acceptance.     Plan Discussed with: CRNA and Anesthesiologist  Anesthesia Plan Comments: (Mac and local)        Anesthesia Quick Evaluation

## 2015-08-30 NOTE — Discharge Instructions (Signed)
Circumcision-Home Care Instructions  The following instructions have been prepared to help you care for yourself upon your return home today.   Wound Care & Hygiene:   You may apply ice to the penis.  This may help to decrease swelling.  Remove the dressing tomorrow.  If the dressing falls off before then, leave it off.  You may shower or bathe in 48 hours  Gently wash the penis with soap and water.  The stitches do not need to be removed.  Activity:  Do not drive or operate any equipment today.  The effects of anesthesia are still present, drowsiness may result.  Rest today, not necessarily flat bed rest, just take it easy.  You may resume your normal activity in one to two days or as indicated by your physician.  Sexual Activity:  Erection and sexual relations should be avoided for *2 weeks.  Return to Work:  One to two days or as indicated by your physician   Diet:  Drink liquids or eat a very light diet this evening.  You may resume a regular diet tomorrow.  General Expectations of your surgery:   You may have a small amount of bleeding  The penis will be swollen and bruised for approximately one week  You may wake during the night with an erection, usually this is caused by having a full bladder so you should try to urinate (pass your water) to relieve the erection or apply ice to the penis  Unexpected Observations - Call your doctor if these occur!  Persistent or heavy bleeding  Temperature of 101 degrees or more  Severe pain not relieved by medication   Post Anesthesia Home Care Instructions  Activity: Get plenty of rest for the remainder of the day. A responsible adult should stay with you for 24 hours following the procedure.  For the next 24 hours, DO NOT: -Drive a car -Paediatric nurse -Drink alcoholic beverages -Take any medication unless instructed by your physician -Make any legal decisions or sign important papers.  Meals: Start with liquid foods such  as gelatin or soup. Progress to regular foods as tolerated. Avoid greasy, spicy, heavy foods. If nausea and/or vomiting occur, drink only clear liquids until the nausea and/or vomiting subsides. Call your physician if vomiting continues.  Special Instructions/Symptoms: Your throat may feel dry or sore from the anesthesia or the breathing tube placed in your throat during surgery. If this causes discomfort, gargle with warm salt water. The discomfort should disappear within 24 hours.  If you had a scopolamine patch placed behind your ear for the management of post- operative nausea and/or vomiting:  1. The medication in the patch is effective for 72 hours, after which it should be removed.  Wrap patch in a tissue and discard in the trash. Wash hands thoroughly with soap and water. 2. You may remove the patch earlier than 72 hours if you experience unpleasant side effects which may include dry mouth, dizziness or visual disturbances. 3. Avoid touching the patch. Wash your hands with soap and water after contact with the patch.

## 2015-09-01 ENCOUNTER — Encounter (HOSPITAL_BASED_OUTPATIENT_CLINIC_OR_DEPARTMENT_OTHER): Payer: Self-pay | Admitting: Urology

## 2015-09-01 NOTE — Anesthesia Postprocedure Evaluation (Signed)
Anesthesia Post Note  Patient: Miguel Blake  Procedure(s) Performed: Procedure(s) (LRB): CIRCUMCISION ADULT (N/A)  Patient location during evaluation: PACU Anesthesia Type: MAC Level of consciousness: awake and alert Pain management: pain level controlled Vital Signs Assessment: post-procedure vital signs reviewed and stable Respiratory status: spontaneous breathing, nonlabored ventilation, respiratory function stable and patient connected to nasal cannula oxygen Cardiovascular status: stable and blood pressure returned to baseline Anesthetic complications: no    Last Vitals:  Filed Vitals:   08/30/15 1315 08/30/15 1438  BP: 164/86 168/90  Pulse: 68 72  Temp:  36.6 C  Resp: 15 16    Last Pain:  Filed Vitals:   08/31/15 0926  PainSc: 0-No pain                 Zenaida Deed

## 2015-09-13 ENCOUNTER — Other Ambulatory Visit: Payer: Self-pay | Admitting: Endocrinology

## 2015-09-19 DIAGNOSIS — N5089 Other specified disorders of the male genital organs: Secondary | ICD-10-CM | POA: Diagnosis not present

## 2015-10-03 DIAGNOSIS — N5089 Other specified disorders of the male genital organs: Secondary | ICD-10-CM | POA: Diagnosis not present

## 2015-10-04 ENCOUNTER — Other Ambulatory Visit: Payer: Self-pay | Admitting: Endocrinology

## 2015-10-10 DIAGNOSIS — Z0000 Encounter for general adult medical examination without abnormal findings: Secondary | ICD-10-CM | POA: Diagnosis not present

## 2015-10-10 DIAGNOSIS — R69 Illness, unspecified: Secondary | ICD-10-CM | POA: Diagnosis not present

## 2015-10-10 DIAGNOSIS — E784 Other hyperlipidemia: Secondary | ICD-10-CM | POA: Diagnosis not present

## 2015-10-10 DIAGNOSIS — K210 Gastro-esophageal reflux disease with esophagitis: Secondary | ICD-10-CM | POA: Diagnosis not present

## 2015-10-10 DIAGNOSIS — I10 Essential (primary) hypertension: Secondary | ICD-10-CM | POA: Diagnosis not present

## 2015-11-15 ENCOUNTER — Ambulatory Visit (INDEPENDENT_AMBULATORY_CARE_PROVIDER_SITE_OTHER): Payer: Medicare HMO | Admitting: Endocrinology

## 2015-11-15 ENCOUNTER — Encounter: Payer: Self-pay | Admitting: Endocrinology

## 2015-11-15 VITALS — BP 128/82 | HR 81 | Wt 251.0 lb

## 2015-11-15 DIAGNOSIS — R0600 Dyspnea, unspecified: Secondary | ICD-10-CM

## 2015-11-15 DIAGNOSIS — M79602 Pain in left arm: Secondary | ICD-10-CM

## 2015-11-15 MED ORDER — DICLOFENAC SODIUM 1 % TD GEL: 4.0000 g | Freq: Four times a day (QID) | TRANSDERMAL | 11 refills | Status: AC

## 2015-11-15 NOTE — Progress Notes (Signed)
Subjective:    Patient ID: Miguel Blake, male    DOB: March 19, 1932, 82 y.o.   MRN: [id number]  HPI Pt states of moderate pain at the right neck and scapular area, but no assoc numbness.  It is exac by turning the the head from side to side.  No assoc numbness.  No help with tramadol.   Past Medical History:  Diagnosis Date  . Anxiety   . Bilateral renal cysts   . Depression   . Dyspnea on exertion    chronic  . ED (erectile dysfunction) of organic origin   . First degree heart block   . GERD (gastroesophageal reflux disease)   . History of pulmonary edema    NON-CARDIOGENIC  . Hx of adenomatous colonic polyps 09/05/2014  . Hyperlipidemia   . Hypertension   . Nocturia   . OA (osteoarthritis)   . Phimosis   . Prostate cancer ([place]) UROLOGIST-  DR Risa Grill   dx 2003--  T1c, Gleason 6, last PSA 4.1 in Oct 2016-- treatment active surivellance   . Renal insufficiency   . Sigmoid diverticulosis     Past Surgical History:  Procedure Laterality Date  . APPENDECTOMY  09-11-1999   via Exploratory laparotomy  . CARDIOVASCULAR STRESS TEST  02-17-2015   dr Stanford Breed   normal myocardial perfusion study w/ no ischemia/   normal LV function and wall motion , ef 62%  . CATARACT EXTRACTION W/ INTRAOCULAR LENS IMPLANT Right 2015  . CIRCUMCISION N/A 08/30/2015   Procedure: CIRCUMCISION ADULT;  Surgeon: Rana Snare, MD;  Location: [facility];  Service: Urology;  Laterality: N/A;  . COLONOSCOPY  last one 08-25-2014  . LAPAROSCOPIC INCISIONAL VENTRAL HERNIA REPAIR W/ MESH  12-24-2000  . TRANSTHORACIC ECHOCARDIOGRAM  02-17-2015   mild focal basal LVH,  grade 1 diastolic dysfunction, ef 40-98%/  mild AV sclerosis without stenosis/  mild AR and TR    Social History   Social History  . Marital status: Widowed    Spouse name: N/A  . Number of children: 4  . Years of education: 10th grade   Occupational History  . 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  Retired   Social  History Main Topics  . Smoking status: Former Smoker    Packs/day: 1.00    Years: 25.00    Types: Cigarettes    Quit date: 02/18/1973  . Smokeless tobacco: Never Used  . Alcohol use No     Comment: hx alcohol abuse --  quit 1970's  . Drug use: No  . Sexual activity: Not on file   Other Topics Concern  . Not on file   Social History Narrative   Retired Financial controller    Current Outpatient Prescriptions on File Prior to Visit  Medication Sig Dispense Refill  . amLODipine (NORVASC) 5 MG tablet Take 1 tablet (5 mg total) by mouth daily. (Patient taking differently: Take 5 mg by mouth every evening. ) 90 tablet 3  . aspirin 81 MG tablet Take 81 mg by mouth daily.    . citalopram (CELEXA) 20 MG tablet TAKE 1 TABLET BY MOUTH DAILY 90 tablet 0  . ezetimibe (ZETIA) 10 MG tablet Take 1 tablet (10 mg total) by mouth daily. 90 tablet 3  . finasteride (PROSCAR) 5 MG tablet TAKE 1 TABLET BY MOUTH DAILY 90 tablet 0  . HYDROcodone-acetaminophen (NORCO) 5-325 MG tablet Take 1-2 tablets by mouth every 6 (six) hours as needed for moderate pain. 20 tablet  0  . losartan-hydrochlorothiazide (HYZAAR) 100-25 MG tablet TAKE 1 TABLET BY MOUTH EVERY DAY 90 tablet 0  . omeprazole (PRILOSEC) 20 MG capsule Take 1 capsule (20 mg total) by mouth daily. (Patient taking differently: Take 20 mg by mouth every evening. ) 30 capsule 11  . tamsulosin (FLOMAX) 0.4 MG CAPS capsule TAKE ONE CAPSULE BY MOUTH EVERY DAY 90 capsule 0  . traMADol (ULTRAM) 50 MG tablet Take 1 tablet (50 mg total) by mouth every 8 (eight) hours as needed. 30 tablet 1   No current facility-administered medications on file prior to visit.     Allergies  Allergen Reactions  . Fluvastatin Sodium Other (See Comments)    REACTION: Myalgia  . Penicillins Other (See Comments)    "made me feel like I was going to die"  . Vasotec [Enalapril] Other (See Comments)    "does not remember"    Family History  Problem Relation Age of Onset  .  Prostate cancer Father   . Colon cancer Neg Hx   . Colon polyps Neg Hx   . Esophageal cancer Neg Hx   . Heart disease Neg Hx   . Diabetes Neg Hx   . Gallbladder disease Neg Hx   . Kidney disease Neg Hx   . Rectal cancer Neg Hx   . Stomach cancer Neg Hx   . Hypertension Mother     BP 128/82 (BP Location: Left Arm, Patient Position: Sitting)   Pulse 81   Wt 251 lb (113.9 kg)   SpO2 94%   BMI 36.01 kg/m   Review of Systems No change in chronic doe. He has dry mouth also.  Chronic leg swelling is worse.     Objective:   Physical Exam VITAL SIGNS:  See vs page GENERAL: no distress Neck: painful on twisting from side to side Pulses: dorsalis pedis intact bilat.   MSK: no deformity of the feet CV: 2+ bilat leg edema Skin:  no ulcer on the feet.  normal color and temp on the feet. Neuro: sensation is intact to touch on the feet.   ESR=66 Lab Results  Component Value Date   CREATININE 1.43 11/15/2015   BUN 19 11/15/2015   NA 133 (L) 11/15/2015   K 4.1 11/15/2015   CL 100 11/15/2015   CO2 25 11/15/2015       Assessment & Plan:  Arthralgia, new, with high ESR.  Ref rheumatol.   Localized edema.  I advised compression stockings. Renal insuff: worse, so he is not a candidate for another diuretic

## 2015-11-15 NOTE — Patient Instructions (Addendum)
blood tests are requested for you today.  We'll let you know about the results. I have sent a prescription to your pharmacy, for a pain-relieving cream Please let us know if you want to do an x-ray of your neck, or to see a specialist.   Please come back for a follow-up appointment in 6 weeks

## 2015-11-16 ENCOUNTER — Telehealth: Payer: Self-pay | Admitting: Endocrinology

## 2015-11-16 LAB — BASIC METABOLIC PANEL
BUN: 19 mg/dL (ref 6–23)
CALCIUM: 9.4 mg/dL (ref 8.4–10.5)
CO2: 25 mEq/L (ref 19–32)
CREATININE: 1.43 mg/dL (ref 0.40–1.50)
Chloride: 100 mEq/L (ref 96–112)
GFR: 61.02 mL/min (ref >60.00)
Glucose, Bld: 94 mg/dL (ref 70–99)
Potassium: 4.1 mEq/L (ref 3.5–5.1)
Sodium: 133 mEq/L — ABNORMAL LOW (ref 135–145)

## 2015-11-16 LAB — BRAIN NATRIURETIC PEPTIDE: PRO B NATRI PEPTIDE: 41 pg/mL (ref 0.0–100.0)

## 2015-11-16 LAB — SEDIMENTATION RATE: SED RATE: 66 mm/h — AB (ref 0–20)

## 2015-11-16 NOTE — Telephone Encounter (Signed)
please call patient: The blood test says you have fluid in the legs, but not too much in the body in general.  Therefore, the treatment for the swelling in the legs is compression stockings.  I would be happy to order for you.

## 2015-11-17 NOTE — Telephone Encounter (Signed)
I contacted the patient and advised of message. Patient declined written prescription for the compression stockings at this time and is going to check with [facility] on purchasing them. Patient advised to call back if he has any issues locating and purchasing the compression stockings.

## 2015-11-22 ENCOUNTER — Telehealth: Payer: Self-pay | Admitting: Endocrinology

## 2015-11-22 NOTE — Telephone Encounter (Signed)
Pt called and had questions regarding his feet swelling badly, requests call back to discuss with nurse.

## 2015-11-23 ENCOUNTER — Other Ambulatory Visit: Payer: Self-pay | Admitting: Endocrinology

## 2015-11-24 ENCOUNTER — Other Ambulatory Visit: Payer: Self-pay | Admitting: Endocrinology

## 2015-11-24 NOTE — Progress Notes (Signed)
   Subjective:    Patient ID: Miguel Blake, male    DOB: 1932/08/21, 82 y.o.   MRN: [id number]  HPI    Review of Systems     Objective:   Physical Exam        Assessment & Plan:

## 2015-11-24 NOTE — Progress Notes (Signed)
The blood test says you have excess fluid in the legs, but not throughout the body--good I agree that the treatment is compression stockings.

## 2015-11-24 NOTE — Telephone Encounter (Signed)
Spoke with patient about the swelling he is experiencing and he stated that the swelling is better during the day as long as he wears the socks and he is elevating his feet at night

## 2015-11-24 NOTE — Telephone Encounter (Signed)
I contacted the patient and advised of message. He verbalized understanding and had no further questions at this time.

## 2015-11-24 NOTE — Telephone Encounter (Signed)
The blood test says there is excess fluid just in the ;legs, but not througout the body.  I agree that compression stockings are the best option.

## 2015-11-24 NOTE — Progress Notes (Signed)
Patient ID: Miguel Blake, male   DOB: Apr 28, 1932, 82 y.o.   MRN: [id number]

## 2015-11-30 ENCOUNTER — Other Ambulatory Visit: Payer: Self-pay | Admitting: Endocrinology

## 2015-12-06 ENCOUNTER — Other Ambulatory Visit: Payer: Self-pay | Admitting: Endocrinology

## 2015-12-19 ENCOUNTER — Other Ambulatory Visit: Payer: Self-pay | Admitting: Endocrinology

## 2015-12-25 ENCOUNTER — Other Ambulatory Visit: Payer: Self-pay | Admitting: Endocrinology

## 2015-12-27 ENCOUNTER — Ambulatory Visit: Payer: Medicare HMO | Admitting: Endocrinology

## 2016-01-01 ENCOUNTER — Other Ambulatory Visit: Payer: Self-pay | Admitting: Endocrinology

## 2016-01-04 DIAGNOSIS — Z23 Encounter for immunization: Secondary | ICD-10-CM | POA: Diagnosis not present

## 2016-01-04 DIAGNOSIS — M2550 Pain in unspecified joint: Secondary | ICD-10-CM | POA: Diagnosis not present

## 2016-01-04 DIAGNOSIS — R700 Elevated erythrocyte sedimentation rate: Secondary | ICD-10-CM | POA: Diagnosis not present

## 2016-01-04 DIAGNOSIS — M1990 Unspecified osteoarthritis, unspecified site: Secondary | ICD-10-CM | POA: Diagnosis not present

## 2016-01-04 DIAGNOSIS — Z79899 Other long term (current) drug therapy: Secondary | ICD-10-CM | POA: Diagnosis not present

## 2016-01-04 DIAGNOSIS — M5031 Other cervical disc degeneration,  high cervical region: Secondary | ICD-10-CM | POA: Diagnosis not present

## 2016-01-04 DIAGNOSIS — M542 Cervicalgia: Secondary | ICD-10-CM | POA: Diagnosis not present

## 2016-01-04 DIAGNOSIS — M25519 Pain in unspecified shoulder: Secondary | ICD-10-CM | POA: Diagnosis not present

## 2016-01-04 DIAGNOSIS — M353 Polymyalgia rheumatica: Secondary | ICD-10-CM | POA: Diagnosis not present

## 2016-01-04 DIAGNOSIS — M5136 Other intervertebral disc degeneration, lumbar region: Secondary | ICD-10-CM | POA: Diagnosis not present

## 2016-01-15 DIAGNOSIS — Z8546 Personal history of malignant neoplasm of prostate: Secondary | ICD-10-CM | POA: Diagnosis not present

## 2016-01-18 DIAGNOSIS — M810 Age-related osteoporosis without current pathological fracture: Secondary | ICD-10-CM | POA: Diagnosis not present

## 2016-01-18 DIAGNOSIS — R700 Elevated erythrocyte sedimentation rate: Secondary | ICD-10-CM | POA: Diagnosis not present

## 2016-01-18 DIAGNOSIS — M353 Polymyalgia rheumatica: Secondary | ICD-10-CM | POA: Diagnosis not present

## 2016-01-18 DIAGNOSIS — M542 Cervicalgia: Secondary | ICD-10-CM | POA: Diagnosis not present

## 2016-01-18 DIAGNOSIS — M2550 Pain in unspecified joint: Secondary | ICD-10-CM | POA: Diagnosis not present

## 2016-01-18 DIAGNOSIS — M1990 Unspecified osteoarthritis, unspecified site: Secondary | ICD-10-CM | POA: Diagnosis not present

## 2016-01-19 DIAGNOSIS — C61 Malignant neoplasm of prostate: Secondary | ICD-10-CM | POA: Diagnosis not present

## 2016-01-19 DIAGNOSIS — N471 Phimosis: Secondary | ICD-10-CM | POA: Diagnosis not present

## 2016-01-26 ENCOUNTER — Other Ambulatory Visit: Payer: Self-pay | Admitting: Endocrinology

## 2016-02-26 ENCOUNTER — Other Ambulatory Visit: Payer: Self-pay

## 2016-02-26 MED ORDER — TAMSULOSIN HCL 0.4 MG PO CAPS: ORAL_CAPSULE | ORAL | 1 refills | Status: DC

## 2016-02-29 DIAGNOSIS — M542 Cervicalgia: Secondary | ICD-10-CM | POA: Diagnosis not present

## 2016-02-29 DIAGNOSIS — M810 Age-related osteoporosis without current pathological fracture: Secondary | ICD-10-CM | POA: Diagnosis not present

## 2016-02-29 DIAGNOSIS — M1990 Unspecified osteoarthritis, unspecified site: Secondary | ICD-10-CM | POA: Diagnosis not present

## 2016-02-29 DIAGNOSIS — R700 Elevated erythrocyte sedimentation rate: Secondary | ICD-10-CM | POA: Diagnosis not present

## 2016-02-29 DIAGNOSIS — Z1321 Encounter for screening for nutritional disorder: Secondary | ICD-10-CM | POA: Diagnosis not present

## 2016-02-29 DIAGNOSIS — M353 Polymyalgia rheumatica: Secondary | ICD-10-CM | POA: Diagnosis not present

## 2016-03-05 DIAGNOSIS — R600 Localized edema: Secondary | ICD-10-CM | POA: Diagnosis not present

## 2016-03-05 DIAGNOSIS — N402 Nodular prostate without lower urinary tract symptoms: Secondary | ICD-10-CM | POA: Diagnosis not present

## 2016-03-05 DIAGNOSIS — I10 Essential (primary) hypertension: Secondary | ICD-10-CM | POA: Diagnosis not present

## 2016-03-05 DIAGNOSIS — K210 Gastro-esophageal reflux disease with esophagitis: Secondary | ICD-10-CM | POA: Diagnosis not present

## 2016-03-05 DIAGNOSIS — M130 Polyarthritis, unspecified: Secondary | ICD-10-CM | POA: Diagnosis not present

## 2016-03-05 DIAGNOSIS — E669 Obesity, unspecified: Secondary | ICD-10-CM | POA: Diagnosis not present

## 2016-03-05 DIAGNOSIS — Z87891 Personal history of nicotine dependence: Secondary | ICD-10-CM | POA: Diagnosis not present

## 2016-03-05 DIAGNOSIS — E785 Hyperlipidemia, unspecified: Secondary | ICD-10-CM | POA: Diagnosis not present

## 2016-03-05 DIAGNOSIS — Z0000 Encounter for general adult medical examination without abnormal findings: Secondary | ICD-10-CM | POA: Diagnosis not present

## 2016-03-05 DIAGNOSIS — R69 Illness, unspecified: Secondary | ICD-10-CM | POA: Diagnosis not present

## 2016-03-05 DIAGNOSIS — Z79899 Other long term (current) drug therapy: Secondary | ICD-10-CM | POA: Diagnosis not present

## 2016-03-05 DIAGNOSIS — M47819 Spondylosis without myelopathy or radiculopathy, site unspecified: Secondary | ICD-10-CM | POA: Diagnosis not present
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
## 2016-03-12 ENCOUNTER — Ambulatory Visit (INDEPENDENT_AMBULATORY_CARE_PROVIDER_SITE_OTHER): Payer: Medicare HMO | Admitting: Endocrinology

## 2016-03-12 ENCOUNTER — Encounter: Payer: Self-pay | Admitting: Endocrinology

## 2016-03-12 VITALS — BP 132/88 | HR 80 | Ht 70.0 in | Wt 253.0 lb

## 2016-03-12 DIAGNOSIS — Z125 Encounter for screening for malignant neoplasm of prostate: Secondary | ICD-10-CM

## 2016-03-12 DIAGNOSIS — R0602 Shortness of breath: Secondary | ICD-10-CM | POA: Diagnosis not present

## 2016-03-12 DIAGNOSIS — R739 Hyperglycemia, unspecified: Secondary | ICD-10-CM

## 2016-03-12 DIAGNOSIS — E7800 Pure hypercholesterolemia, unspecified: Secondary | ICD-10-CM | POA: Diagnosis not present

## 2016-03-12 DIAGNOSIS — I10 Essential (primary) hypertension: Secondary | ICD-10-CM

## 2016-03-12 DIAGNOSIS — E871 Hypo-osmolality and hyponatremia: Secondary | ICD-10-CM

## 2016-03-12 DIAGNOSIS — D500 Iron deficiency anemia secondary to blood loss (chronic): Secondary | ICD-10-CM

## 2016-03-12 LAB — CBC WITH DIFFERENTIAL/PLATELET
BASOS ABS: 0 10*3/uL (ref 0.0–0.1)
Basophils Relative: 0.3 % (ref 0.0–3.0)
Eosinophils Absolute: 0.2 10*3/uL (ref 0.0–0.7)
Eosinophils Relative: 2.5 % (ref 0.0–5.0)
HCT: 41.4 % (ref 39.0–52.0)
HEMOGLOBIN: 13.6 g/dL (ref 13.0–17.0)
LYMPHS ABS: 2 10*3/uL (ref 0.7–4.0)
Lymphocytes Relative: 23.3 % (ref 12.0–46.0)
MCHC: 32.9 g/dL (ref 30.0–36.0)
MCV: 85.4 fl (ref 78.0–100.0)
MONO ABS: 0.9 10*3/uL (ref 0.1–1.0)
MONOS PCT: 10.3 % (ref 3.0–12.0)
NEUTROS PCT: 63.6 % (ref 43.0–77.0)
Neutro Abs: 5.5 10*3/uL (ref 1.4–7.7)
Platelets: 215 10*3/uL (ref 150.0–400.0)
RBC: 4.85 Mil/uL (ref 4.22–5.81)
RDW: 15.9 % — ABNORMAL HIGH (ref 11.5–15.5)
WBC: 8.7 10*3/uL (ref 4.0–10.5)

## 2016-03-12 LAB — BASIC METABOLIC PANEL
BUN: 28 mg/dL — AB (ref 6–23)
CHLORIDE: 99 meq/L (ref 96–112)
CO2: 29 meq/L (ref 19–32)
Calcium: 9.8 mg/dL (ref 8.4–10.5)
Creatinine, Ser: 1.67 mg/dL — ABNORMAL HIGH (ref 0.40–1.50)
GFR: 50.98 mL/min — ABNORMAL LOW (ref >60.00)
GLUCOSE: 111 mg/dL — AB (ref 70–99)
POTASSIUM: 3.8 meq/L (ref 3.5–5.1)
Sodium: 134 mEq/L — ABNORMAL LOW (ref 135–145)

## 2016-03-12 LAB — URINALYSIS, ROUTINE W REFLEX MICROSCOPIC
Bilirubin Urine: NEGATIVE
Hgb urine dipstick: NEGATIVE
Leukocytes, UA: NEGATIVE
NITRITE: NEGATIVE
RBC / HPF: NONE SEEN (ref 0–?)
Specific Gravity, Urine: 1.02 (ref 1.000–1.030)
Total Protein, Urine: NEGATIVE
Urine Glucose: NEGATIVE
Urobilinogen, UA: 0.2 (ref 0.0–1.0)
pH: 6 (ref 5.0–8.0)

## 2016-03-12 LAB — LIPID PANEL
CHOLESTEROL: 214 mg/dL — AB (ref 0–200)
HDL: 54.1 mg/dL (ref >39.00)
LDL Cholesterol: 127 mg/dL — ABNORMAL HIGH (ref 0–99)
NonHDL: 159.95
TRIGLYCERIDES: 165 mg/dL — AB (ref 0.0–149.0)
Total CHOL/HDL Ratio: 4
VLDL: 33 mg/dL (ref 0.0–40.0)

## 2016-03-12 LAB — IBC PANEL
Iron: 50 ug/dL (ref 42–165)
SATURATION RATIOS: 14.2 % — AB (ref 20.0–50.0)
TRANSFERRIN: 251 mg/dL (ref 212.0–360.0)

## 2016-03-12 LAB — PSA: PSA: 7.7 ng/mL — AB (ref 0.10–4.00)

## 2016-03-12 LAB — BRAIN NATRIURETIC PEPTIDE: Pro B Natriuretic peptide (BNP): 32 pg/mL (ref 0.0–100.0)

## 2016-03-12 LAB — HEMOGLOBIN A1C: HEMOGLOBIN A1C: 7 % — AB (ref 4.6–6.5)

## 2016-03-12 LAB — TSH: TSH: 1.59 u[IU]/mL (ref 0.35–4.50)

## 2016-03-12 NOTE — Patient Instructions (Addendum)
good diet and exercise significantly improve your health.  please let me know if you wish to be referred to a dietician.  high blood sugar is very risky to your health.  you should see an eye doctor and dentist every year.  It is very important to get all recommended vaccinations.  blood tests are requested for you today.  We'll let you know about the results.  It is critically important to prevent falling down (keep floor areas well-lit, dry, and free of loose objects.  If you have a cane, walker, or wheelchair, you should use it, even for short trips around the house.  Wear flat-soled shoes.  Also, try not to rush).  Please stop taking the tramadol, due to an interaction.    Please come back for a follow-up appointment in 6 months.

## 2016-03-12 NOTE — Progress Notes (Signed)
we discussed code status.  pt requests full code, but would not want to be started or maintained on artificial life-support measures if there was not a reasonable chance of recovery 

## 2016-03-18 ENCOUNTER — Telehealth: Payer: Self-pay | Admitting: Endocrinology

## 2016-03-18 MED ORDER — TAMSULOSIN HCL 0.4 MG PO CAPS: 0.4000 mg | ORAL_CAPSULE | Freq: Every day | ORAL | 0 refills | Status: DC

## 2016-03-18 MED ORDER — AMLODIPINE BESYLATE 5 MG PO TABS: ORAL_TABLET | ORAL | 0 refills | Status: DC

## 2016-03-18 MED ORDER — LOSARTAN POTASSIUM-HCTZ 100-25 MG PO TABS: 1.0000 | ORAL_TABLET | Freq: Every day | ORAL | 0 refills | Status: DC

## 2016-03-18 MED ORDER — CITALOPRAM HYDROBROMIDE 20 MG PO TABS: 20.0000 mg | ORAL_TABLET | Freq: Every day | ORAL | 0 refills | Status: DC

## 2016-03-18 MED ORDER — EZETIMIBE 10 MG PO TABS: 10.0000 mg | ORAL_TABLET | Freq: Every day | ORAL | 0 refills | Status: DC

## 2016-03-18 MED ORDER — FINASTERIDE 5 MG PO TABS: 5.0000 mg | ORAL_TABLET | Freq: Every day | ORAL | 0 refills | Status: DC

## 2016-03-18 NOTE — Telephone Encounter (Signed)
Please call in all the rxs the [facility] [place] [facility]  Thank [facility]!

## 2016-03-18 NOTE — Telephone Encounter (Signed)
Refills submitted.  

## 2016-03-19 ENCOUNTER — Telehealth: Payer: Self-pay | Admitting: Endocrinology

## 2016-03-19 NOTE — Telephone Encounter (Signed)
Patient ask you to give him a call,

## 2016-03-19 NOTE — Telephone Encounter (Signed)
I contacted the patient and he stated he had called earlier to verify if his rx's had been submitted. Patient stated since calling and has be in touch with the drug store and was notified the RX's has been submitted. Patient had no further questions at this time.

## 2016-04-08 ENCOUNTER — Other Ambulatory Visit: Payer: Self-pay

## 2016-04-08 MED ORDER — OMEPRAZOLE 20 MG PO CPDR: DELAYED_RELEASE_CAPSULE | ORAL | 2 refills | Status: DC

## 2016-04-11 ENCOUNTER — Telehealth: Payer: Self-pay | Admitting: Endocrinology

## 2016-04-11 MED ORDER — OMEPRAZOLE 20 MG PO CPDR: DELAYED_RELEASE_CAPSULE | ORAL | 2 refills | Status: DC

## 2016-04-11 NOTE — Telephone Encounter (Signed)
Pt needs his Omeprazole 20MG  [facility] [address].

## 2016-04-11 NOTE — Telephone Encounter (Signed)
Refill submitted. 

## 2016-04-25 DIAGNOSIS — M353 Polymyalgia rheumatica: Secondary | ICD-10-CM | POA: Diagnosis not present

## 2016-04-25 DIAGNOSIS — M542 Cervicalgia: Secondary | ICD-10-CM | POA: Diagnosis not present

## 2016-04-25 DIAGNOSIS — M810 Age-related osteoporosis without current pathological fracture: Secondary | ICD-10-CM | POA: Diagnosis not present

## 2016-04-25 DIAGNOSIS — M1990 Unspecified osteoarthritis, unspecified site: Secondary | ICD-10-CM | POA: Diagnosis not present

## 2016-04-26 ENCOUNTER — Other Ambulatory Visit: Payer: Self-pay | Admitting: Endocrinology

## 2016-06-04 ENCOUNTER — Other Ambulatory Visit: Payer: Self-pay

## 2016-06-04 MED ORDER — OMEPRAZOLE 20 MG PO CPDR: DELAYED_RELEASE_CAPSULE | ORAL | 1 refills | Status: DC

## 2016-06-13 ENCOUNTER — Other Ambulatory Visit: Payer: Self-pay | Admitting: Endocrinology

## 2016-06-14 ENCOUNTER — Other Ambulatory Visit: Payer: Self-pay | Admitting: Endocrinology

## 2016-06-27 DIAGNOSIS — M810 Age-related osteoporosis without current pathological fracture: Secondary | ICD-10-CM | POA: Diagnosis not present

## 2016-06-27 DIAGNOSIS — M1990 Unspecified osteoarthritis, unspecified site: Secondary | ICD-10-CM | POA: Diagnosis not present

## 2016-06-27 DIAGNOSIS — M353 Polymyalgia rheumatica: Secondary | ICD-10-CM | POA: Diagnosis not present

## 2016-06-27 DIAGNOSIS — M542 Cervicalgia: Secondary | ICD-10-CM | POA: Diagnosis not present

## 2016-06-29 ENCOUNTER — Other Ambulatory Visit: Payer: Self-pay | Admitting: Endocrinology

## 2016-07-11 ENCOUNTER — Other Ambulatory Visit: Payer: Self-pay | Admitting: Endocrinology

## 2016-07-23 DIAGNOSIS — M353 Polymyalgia rheumatica: Secondary | ICD-10-CM | POA: Diagnosis not present

## 2016-07-23 DIAGNOSIS — M542 Cervicalgia: Secondary | ICD-10-CM | POA: Diagnosis not present

## 2016-07-23 DIAGNOSIS — M5030 Other cervical disc degeneration, unspecified cervical region: Secondary | ICD-10-CM | POA: Diagnosis not present

## 2016-07-23 DIAGNOSIS — G8929 Other chronic pain: Secondary | ICD-10-CM | POA: Diagnosis not present

## 2016-08-22 DIAGNOSIS — M542 Cervicalgia: Secondary | ICD-10-CM | POA: Diagnosis not present

## 2016-08-22 DIAGNOSIS — M353 Polymyalgia rheumatica: Secondary | ICD-10-CM | POA: Diagnosis not present

## 2016-08-22 DIAGNOSIS — G8929 Other chronic pain: Secondary | ICD-10-CM | POA: Diagnosis not present

## 2016-08-22 DIAGNOSIS — M5030 Other cervical disc degeneration, unspecified cervical region: Secondary | ICD-10-CM | POA: Diagnosis not present

## 2016-09-09 ENCOUNTER — Ambulatory Visit: Payer: Medicare HMO | Admitting: Endocrinology

## 2016-09-09 ENCOUNTER — Other Ambulatory Visit: Payer: Self-pay | Admitting: Endocrinology

## 2016-09-17 DIAGNOSIS — M545 Low back pain: Secondary | ICD-10-CM | POA: Diagnosis not present

## 2016-09-17 DIAGNOSIS — M438X9 Other specified deforming dorsopathies, site unspecified: Secondary | ICD-10-CM | POA: Diagnosis not present

## 2016-09-27 DIAGNOSIS — M542 Cervicalgia: Secondary | ICD-10-CM | POA: Diagnosis not present

## 2016-09-27 DIAGNOSIS — M353 Polymyalgia rheumatica: Secondary | ICD-10-CM | POA: Diagnosis not present

## 2016-09-27 DIAGNOSIS — M25569 Pain in unspecified knee: Secondary | ICD-10-CM | POA: Diagnosis not present

## 2016-09-27 DIAGNOSIS — M1990 Unspecified osteoarthritis, unspecified site: Secondary | ICD-10-CM | POA: Diagnosis not present

## 2016-10-12 ENCOUNTER — Other Ambulatory Visit: Payer: Self-pay | Admitting: Endocrinology

## 2016-10-14 DIAGNOSIS — M1712 Unilateral primary osteoarthritis, left knee: Secondary | ICD-10-CM | POA: Diagnosis not present

## 2016-10-17 ENCOUNTER — Other Ambulatory Visit: Payer: Self-pay | Admitting: Endocrinology

## 2016-11-06 ENCOUNTER — Telehealth: Payer: Self-pay | Admitting: Endocrinology

## 2016-11-06 ENCOUNTER — Ambulatory Visit (INDEPENDENT_AMBULATORY_CARE_PROVIDER_SITE_OTHER): Payer: Medicare HMO | Admitting: Endocrinology

## 2016-11-06 ENCOUNTER — Other Ambulatory Visit: Payer: Self-pay

## 2016-11-06 ENCOUNTER — Encounter: Payer: Self-pay | Admitting: Endocrinology

## 2016-11-06 VITALS — BP 138/90 | HR 66

## 2016-11-06 DIAGNOSIS — Z23 Encounter for immunization: Secondary | ICD-10-CM

## 2016-11-06 DIAGNOSIS — R0600 Dyspnea, unspecified: Secondary | ICD-10-CM

## 2016-11-06 LAB — BASIC METABOLIC PANEL
BUN: 17 mg/dL (ref 6–23)
CALCIUM: 9.6 mg/dL (ref 8.4–10.5)
CO2: 29 meq/L (ref 19–32)
CREATININE: 1.33 mg/dL (ref 0.40–1.50)
Chloride: 101 mEq/L (ref 96–112)
GFR: 66.18 mL/min (ref >60.00)
GLUCOSE: 112 mg/dL — AB (ref 70–99)
Potassium: 3.7 mEq/L (ref 3.5–5.1)
SODIUM: 136 meq/L (ref 135–145)

## 2016-11-06 LAB — BRAIN NATRIURETIC PEPTIDE: Pro B Natriuretic peptide (BNP): 109 pg/mL — ABNORMAL HIGH (ref 0.0–100.0)

## 2016-11-06 MED ORDER — AMLODIPINE BESYLATE 5 MG PO TABS: 2.5000 mg | ORAL_TABLET | Freq: Every day | ORAL | 3 refills | Status: DC

## 2016-11-06 MED ORDER — FUROSEMIDE 20 MG PO TABS: 20.0000 mg | ORAL_TABLET | Freq: Every day | ORAL | 3 refills | Status: DC

## 2016-11-06 NOTE — Telephone Encounter (Signed)
Called and sent prescription to different pharmacy.

## 2016-11-06 NOTE — Progress Notes (Signed)
Subjective:    Patient ID: Miguel Blake, male    DOB: Feb 24, 1932, 83 y.o.   MRN: [id number]  HPI Pt reports slight doe sensation in the chest, and assoc leg edema.  Past Medical History:  Diagnosis Date  . Anxiety   . Bilateral renal cysts   . Depression   . Dyspnea on exertion    chronic  . ED (erectile dysfunction) of organic origin   . First degree heart block   . GERD (gastroesophageal reflux disease)   . History of pulmonary edema    NON-CARDIOGENIC  . Hx of adenomatous colonic polyps 09/05/2014  . Hyperlipidemia   . Hypertension   . Nocturia   . OA (osteoarthritis)   . Phimosis   . Prostate cancer ([place]) UROLOGIST-  DR Risa Grill   dx 2003--  T1c, Gleason 6, last PSA 4.1 in Oct 2016-- treatment active surivellance   . Renal insufficiency   . Sigmoid diverticulosis     Past Surgical History:  Procedure Laterality Date  . APPENDECTOMY  09-11-1999   via Exploratory laparotomy  . CARDIOVASCULAR STRESS TEST  02-17-2015   dr Stanford Breed   normal myocardial perfusion study w/ no ischemia/   normal LV function and wall motion , ef 62%  . CATARACT EXTRACTION W/ INTRAOCULAR LENS IMPLANT Right 2015  . CIRCUMCISION N/A 08/30/2015   Procedure: CIRCUMCISION ADULT;  Surgeon: Rana Snare, MD;  [facility];  Service: Urology;  Laterality: N/A;  . COLONOSCOPY  last one 08-25-2014  . LAPAROSCOPIC INCISIONAL VENTRAL HERNIA REPAIR W/ MESH  12-24-2000  . TRANSTHORACIC ECHOCARDIOGRAM  02-17-2015   mild focal basal LVH,  grade 1 diastolic dysfunction, ef 36-14%/  mild AV sclerosis without stenosis/  mild AR and TR    Social History   Social History  . Marital status: Widowed    Spouse name: N/A  . Number of children: 4  . Years of education: 10th grade   Occupational History  . 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  Retired   Social History Main Topics  . Smoking status: Former Smoker    Packs/day: 1.00    Years: 25.00    Types: Cigarettes    Quit  date: 02/18/1973  . Smokeless tobacco: Never Used  . Alcohol use No     Comment: hx alcohol abuse --  quit 1970's  . Drug use: No  . Sexual activity: Not on file   Other Topics Concern  . Not on file   Social History Narrative   Retired Financial controller    Current Outpatient Prescriptions on File Prior to Visit  Medication Sig Dispense Refill  . aspirin 81 MG tablet Take 81 mg by mouth daily.    . citalopram (CELEXA) 20 MG tablet TAKE 1 TABLET BY MOUTH EVERY DAY 90 tablet 0  . diclofenac sodium (VOLTAREN) 1 % GEL Apply 4 g topically 4 (four) times daily. 100 g 11  . ezetimibe (ZETIA) 10 MG tablet TAKE 1 TABLET BY MOUTH EVERY DAY 90 tablet 0  . finasteride (PROSCAR) 5 MG tablet TAKE 1 TABLET EVERY DAY 90 tablet 0  . losartan-hydrochlorothiazide (HYZAAR) 100-25 MG tablet TAKE 1 TABLET BY MOUTH EVERY DAY 90 tablet 0  . omeprazole (PRILOSEC) 20 MG capsule TAKE 1 CAPSULE BY MOUTH EVERY DAY 90 capsule 1  . tamsulosin (FLOMAX) 0.4 MG CAPS capsule Take 1 capsule (0.4 mg total) by mouth daily. 90 capsule 0   No current facility-administered medications on file prior  to visit.     Allergies  Allergen Reactions  . Fluvastatin Sodium Other (See Comments)    REACTION: Myalgia  . Penicillins Other (See Comments)    "made me feel like I was going to die"  . Vasotec [Enalapril] Other (See Comments)    "does not remember"    Family History  Problem Relation Age of Onset  . Prostate cancer Father   . Colon cancer Neg Hx   . Colon polyps Neg Hx   . Esophageal cancer Neg Hx   . Heart disease Neg Hx   . Diabetes Neg Hx   . Gallbladder disease Neg Hx   . Kidney disease Neg Hx   . Rectal cancer Neg Hx   . Stomach cancer Neg Hx   . Hypertension Mother     BP 138/90   Pulse 66   SpO2 93%   Review of Systems Denies memory loss.     Objective:   Physical Exam VITAL SIGNS:  See vs page.  GENERAL: no distress LUNGS:  Clear to auscultation Ext: 2+ left leg edema (1+ on the right).       Assessment & Plan:  Edema: worse Dyspnea: persistent, uncertain etiology. HTN: well-controlled   Patient Instructions  blood tests are requested for you today.  We'll let you know about the results. I have sent 2 prescriptions to your pharmacy: to start a fluid pill, and to reduce the amlodipine.  Please come back for a follow-up appointment in 1 month.

## 2016-11-06 NOTE — Telephone Encounter (Signed)
Routing to you °

## 2016-11-06 NOTE — Patient Instructions (Addendum)
blood tests are requested for you today.  We'll let you know about the results. I have sent 2 prescriptions to your pharmacy: to start a fluid pill, and to reduce the amlodipine.  Please come back for a follow-up appointment in 1 month.

## 2016-11-06 NOTE — Telephone Encounter (Signed)
Patient would like to discuss his rx that he was prescribed this morning. The pharmacy does not have the medication and needs an alternative sent in. Call patient to advise as soon as possible.

## 2016-11-07 ENCOUNTER — Other Ambulatory Visit: Payer: Self-pay

## 2016-11-07 MED ORDER — FUROSEMIDE 20 MG PO TABS: 20.0000 mg | ORAL_TABLET | Freq: Every day | ORAL | 3 refills | Status: AC

## 2016-11-07 MED ORDER — AMLODIPINE BESYLATE 5 MG PO TABS: 2.5000 mg | ORAL_TABLET | Freq: Every day | ORAL | 3 refills | Status: AC

## 2016-11-07 NOTE — Telephone Encounter (Signed)
Please resend furosemide (LASIX) 20 MG tablet [[id number] and amLODipine (NORVASC) 5 MG tablet [[id number]] [facility] [place] [facility] [address]  The amlodipine went to the wrong pharmacy and the furosemide was not [facility] the [facility] for some reason.  Ty,  -LL

## 2016-11-08 NOTE — Telephone Encounter (Signed)
Patient called to check the status of the rx refill for the last note below.  [id number] [place], [place] - [address] AT [phone] (Phone) [phone] (Fax)   Still does not have the rx

## 2016-11-11 ENCOUNTER — Other Ambulatory Visit: Payer: Self-pay

## 2016-11-11 NOTE — Telephone Encounter (Signed)
Prescription was sent into correct pharmacy on 9/20.

## 2016-11-29 DIAGNOSIS — I700 Atherosclerosis of aorta: Secondary | ICD-10-CM | POA: Diagnosis not present

## 2016-11-29 DIAGNOSIS — M542 Cervicalgia: Secondary | ICD-10-CM | POA: Diagnosis not present

## 2016-11-29 DIAGNOSIS — Y92410 Unspecified street and highway as the place of occurrence of the external cause: Secondary | ICD-10-CM | POA: Diagnosis not present

## 2016-11-29 DIAGNOSIS — S80811A Abrasion, right lower leg, initial encounter: Secondary | ICD-10-CM | POA: Diagnosis not present

## 2016-11-29 DIAGNOSIS — I6782 Cerebral ischemia: Secondary | ICD-10-CM | POA: Diagnosis not present

## 2016-11-29 DIAGNOSIS — G44319 Acute post-traumatic headache, not intractable: Secondary | ICD-10-CM | POA: Diagnosis not present

## 2016-11-29 DIAGNOSIS — R1084 Generalized abdominal pain: Secondary | ICD-10-CM | POA: Diagnosis not present

## 2016-11-29 DIAGNOSIS — S3991XA Unspecified injury of abdomen, initial encounter: Secondary | ICD-10-CM | POA: Diagnosis not present

## 2016-11-29 DIAGNOSIS — I712 Thoracic aortic aneurysm, without rupture: Secondary | ICD-10-CM | POA: Diagnosis not present

## 2016-11-29 DIAGNOSIS — R51 Headache: Secondary | ICD-10-CM | POA: Diagnosis not present

## 2016-11-29 DIAGNOSIS — S199XXA Unspecified injury of neck, initial encounter: Secondary | ICD-10-CM | POA: Diagnosis not present

## 2016-11-29 DIAGNOSIS — R079 Chest pain, unspecified: Secondary | ICD-10-CM | POA: Diagnosis not present

## 2016-11-29 DIAGNOSIS — N400 Enlarged prostate without lower urinary tract symptoms: Secondary | ICD-10-CM | POA: Diagnosis not present

## 2016-11-29 DIAGNOSIS — N281 Cyst of kidney, acquired: Secondary | ICD-10-CM | POA: Diagnosis not present

## 2016-11-29 DIAGNOSIS — R109 Unspecified abdominal pain: Secondary | ICD-10-CM | POA: Diagnosis not present

## 2016-11-29 DIAGNOSIS — G319 Degenerative disease of nervous system, unspecified: Secondary | ICD-10-CM | POA: Diagnosis not present

## 2016-11-29 DIAGNOSIS — S0990XA Unspecified injury of head, initial encounter: Secondary | ICD-10-CM | POA: Diagnosis not present

## 2016-11-29 DIAGNOSIS — Y998 Other external cause status: Secondary | ICD-10-CM | POA: Diagnosis not present

## 2016-12-06 ENCOUNTER — Ambulatory Visit (INDEPENDENT_AMBULATORY_CARE_PROVIDER_SITE_OTHER): Payer: Medicare HMO | Admitting: Endocrinology

## 2016-12-06 DIAGNOSIS — N62 Hypertrophy of breast: Secondary | ICD-10-CM | POA: Diagnosis not present

## 2016-12-06 DIAGNOSIS — I712 Thoracic aortic aneurysm, without rupture, unspecified: Secondary | ICD-10-CM

## 2016-12-06 NOTE — Patient Instructions (Addendum)
Let's check the mammogram.  you will receive a phone call, about a day and time for an appointment.  Please see a chest surgery specialist.  you will receive a phone call, about a day and time for an appointment. Please continue the same medications. Please come back for a follow-up appointment in 3 months.
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
## 2016-12-09 ENCOUNTER — Other Ambulatory Visit: Payer: Self-pay | Admitting: Endocrinology

## 2016-12-09 DIAGNOSIS — N62 Hypertrophy of breast: Secondary | ICD-10-CM

## 2016-12-10 ENCOUNTER — Telehealth: Payer: Self-pay | Admitting: Endocrinology

## 2016-12-10 ENCOUNTER — Other Ambulatory Visit: Payer: Self-pay | Admitting: Endocrinology

## 2016-12-10 NOTE — Telephone Encounter (Signed)
Patient daughter, is asking if you can get him an appointment in [place], it will be easier  Call Webb Silversmith [phone]

## 2016-12-13 ENCOUNTER — Telehealth: Payer: Self-pay

## 2016-12-13 DIAGNOSIS — N62 Hypertrophy of breast: Secondary | ICD-10-CM

## 2016-12-13 NOTE — Telephone Encounter (Signed)
Sheena called from [place] states patient needs a new referral to an imaging facility in [place] because they can not come to [place]- the referral needs to be faxed directly to the [place] facility per Rogelia Boga

## 2016-12-13 NOTE — Telephone Encounter (Signed)
Caller Name: Webb Silversmith (daughter)  Relationship to Patient: daughter    Best Number: [phone]  Reason for call:  Father's referral is scheduled, but daughter does not know where it was scheduled.  They also would like to be scheduled in [facility].  Called Nonah Mattes at [facility], she stated that we do not schedule for [place], Neoma Laming stated to have patient call [phone] and request that they schedule appointment for patient.  I called daugher Webb Silversmith to convey messge, she expressed understanding.

## 2016-12-15 NOTE — Telephone Encounter (Signed)
done

## 2016-12-15 NOTE — Addendum Note (Signed)
Addended by: Renato Shin on: 12/15/2016 03:29 PM   Modules accepted: Orders

## 2016-12-16 ENCOUNTER — Other Ambulatory Visit: Payer: Self-pay | Admitting: *Deleted

## 2016-12-16 ENCOUNTER — Other Ambulatory Visit: Payer: Medicare HMO

## 2017-03-05 ENCOUNTER — Encounter: Payer: Self-pay | Admitting: Podiatry

## 2017-03-05 ENCOUNTER — Ambulatory Visit: Payer: Medicare Other | Admitting: Podiatry

## 2017-03-05 VITALS — BP 129/76 | HR 77 | Ht 68.0 in | Wt 245.0 lb

## 2017-03-05 DIAGNOSIS — B351 Tinea unguium: Secondary | ICD-10-CM

## 2017-03-05 DIAGNOSIS — M79671 Pain in right foot: Secondary | ICD-10-CM

## 2017-03-05 DIAGNOSIS — M79672 Pain in left foot: Secondary | ICD-10-CM | POA: Diagnosis not present

## 2017-03-05 NOTE — Progress Notes (Signed)
SUBJECTIVE: 84 y.o. year old male presents for diabetic foot care. Having trouble cutting nails. They are too thick and long. Stated that he was informed of diabetic condition about a month ago. His wife passed away 2 years ago. Blood sugar this morning was 113. Patient is referred by Dr. Loanne Drilling.   Review of Systems  Constitutional: Negative.   HENT: Negative.   Eyes: Positive for blurred vision.       Cataract surgery 2 years ago right side.  Respiratory: Negative.   Cardiovascular: Negative.   Gastrointestinal: Negative.   Genitourinary: Negative.  Negative for hematuria.  Musculoskeletal: Negative.        Arthritis in back, shoulder, neck and knee joints. Being treated with Prednisone.  Skin: Negative.     OBJECTIVE: DERMATOLOGIC EXAMINATION: Nails: Thick dystrophic nails with fungal debris x 10.  VASCULAR EXAMINATION OF LOWER LIMBS: Dorsalis Pedis artery: Right foot palpable faintly, not palpable on left. Posterior Tibial artery: Both feet are not palpable. Capillary Filling times within 3 seconds in all digits.  No edema or erythema noted. Temperature gradient from tibial crest to dorsum of foot is within normal bilateral.  NEUROLOGIC EXAMINATION OF THE LOWER LIMBS: Achilles DTR is present and within normal. Monofilament (Semmes-Weinstein 10-gm) sensory testing positive 6 out of 6, bilateral. Vibratory sensations(128Hz  turning fork) intact at medial and lateral forefoot bilateral.  Sharp and Dull discriminatory sensations at the plantar ball of hallux is intact bilateral.   MUSCULOSKELETAL EXAMINATION: Positive for elevated first metatarsal with dorsal bump at the first MCJ right. Flattening MLA right. Forefoot varus right.  ASSESSMENT: Onychomycosis x 10. Diabetic under control. Pain in both feet.  PLAN: Reviewed findings and available treatment options. All nails debrided. Return in 3 months.

## 2017-03-05 NOTE — Patient Instructions (Signed)
Seen for hypertrophic nails. All nails debrided. Return in 3 months or as needed.  

## 2017-03-10 ENCOUNTER — Ambulatory Visit: Payer: Medicare HMO | Admitting: Endocrinology

## 2017-03-17 ENCOUNTER — Other Ambulatory Visit: Payer: Self-pay | Admitting: Internal Medicine

## 2017-03-17 NOTE — Telephone Encounter (Signed)
Is this okay to refill? 

## 2017-03-17 NOTE — Telephone Encounter (Signed)
Per PCP 

## 2017-03-24 ENCOUNTER — Other Ambulatory Visit: Payer: Self-pay | Admitting: Endocrinology

## 2017-03-28 ENCOUNTER — Other Ambulatory Visit: Payer: Self-pay

## 2017-03-28 MED ORDER — TAMSULOSIN HCL 0.4 MG PO CAPS: 0.4000 mg | ORAL_CAPSULE | Freq: Every day | ORAL | 0 refills | Status: AC

## 2017-04-02 IMAGING — CT CT CTA ABD/PEL W/CM AND/OR W/O CM
3 of 9 series · 11 of 46 positions shown, 17 images · IV contrast (Omnipaque 300)
Comparison: 03/30/2012

CLINICAL DATA: Periumbilical abdominal pain.

EXAM:
CT ANGIOGRAPHY ABDOMEN AND PELVIS
TECHNIQUE: Multidetector CT imaging of the abdomen and pelvis was performed
using the standard protocol during bolus administration of
intravenous contrast. Multiplanar reconstructed images including
MIPs were obtained and reviewed to evaluate the vascular anatomy.
CONTRAST:  100 mL Omnipaque 350

[Series 4: cta mesenteric arterial · axial · arterial · 0.78mm/px · z∈[-384,-334]mm · 2 of 184 slices shown]
[im 21/184  soft-tissue]
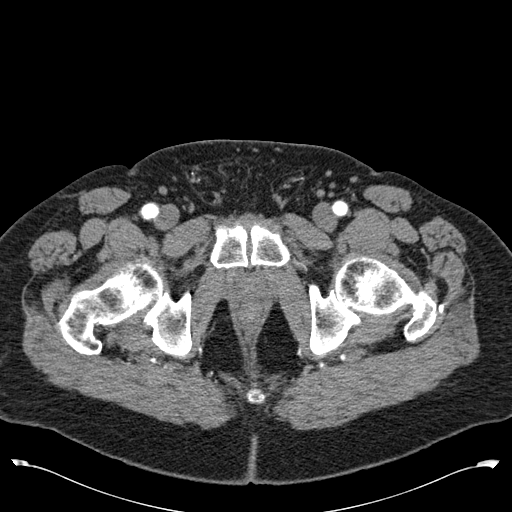
[im 41/184  soft-tissue]
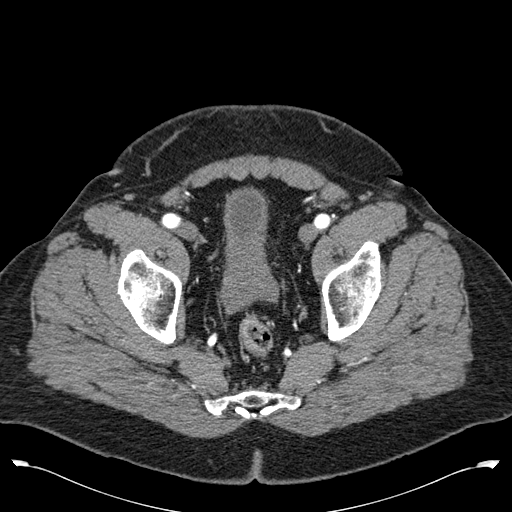

[Series 5: cta mesenteric portal-venous · axial · portal-venous · 0.79mm/px · z∈[-376,-42]mm · 7 of 91 slices shown, 12 images]
[im 12/91  soft-tissue]
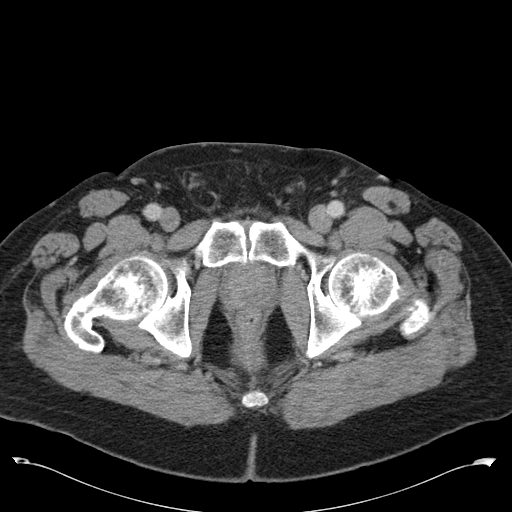
[im 12/91  bone]
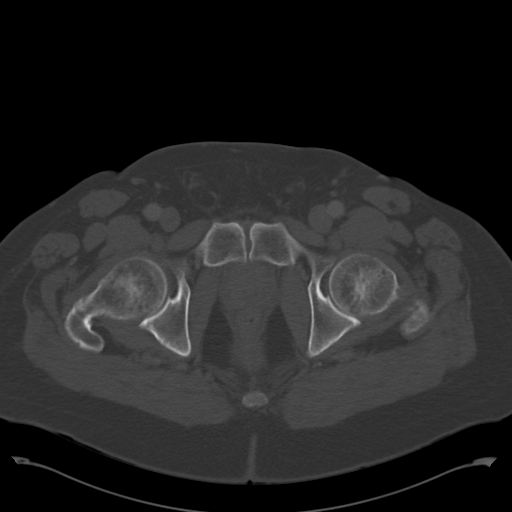
[im 23/91  soft-tissue]
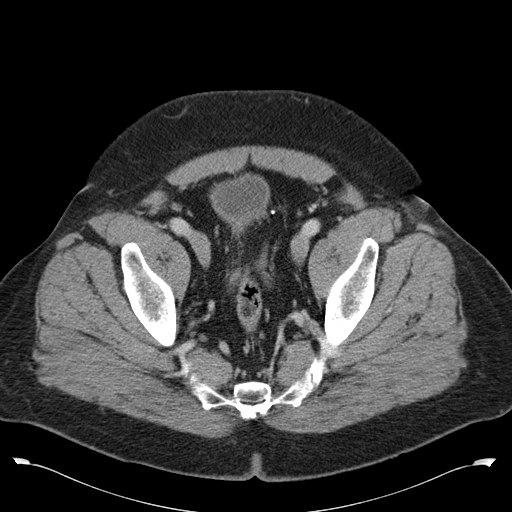
[im 34/91  soft-tissue]
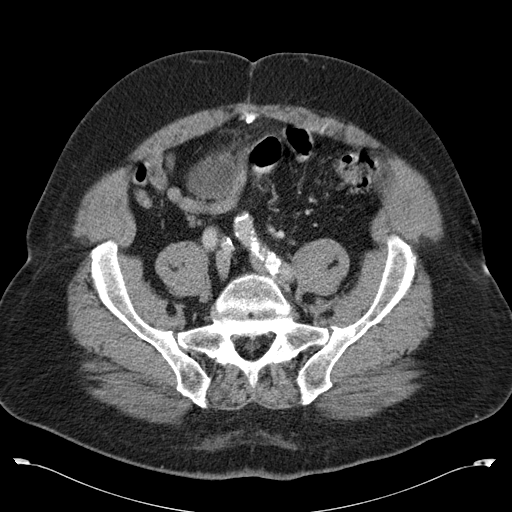
[im 46/91  soft-tissue]
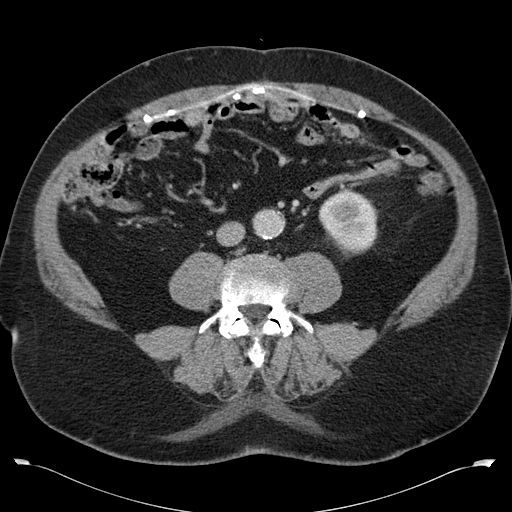
[im 46/91  lung]
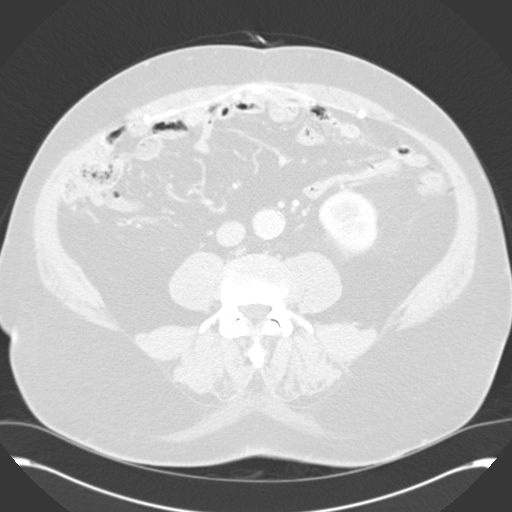
[im 57/91  soft-tissue]
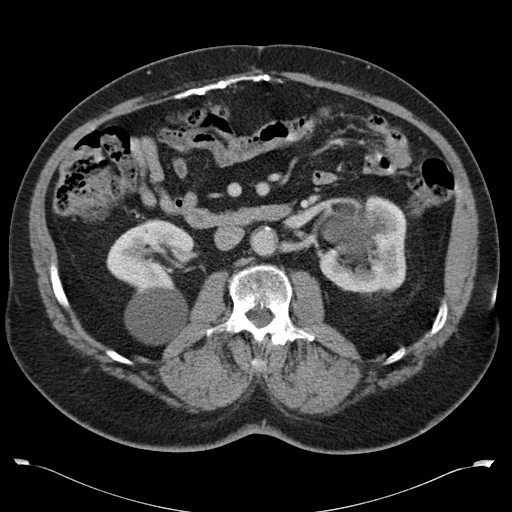
[im 57/91  lung]
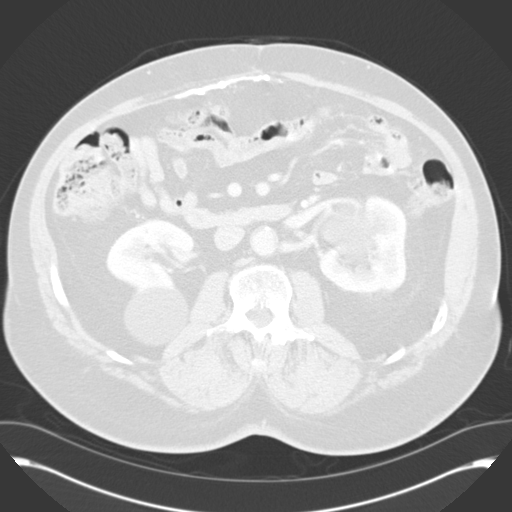
[im 68/91  soft-tissue]
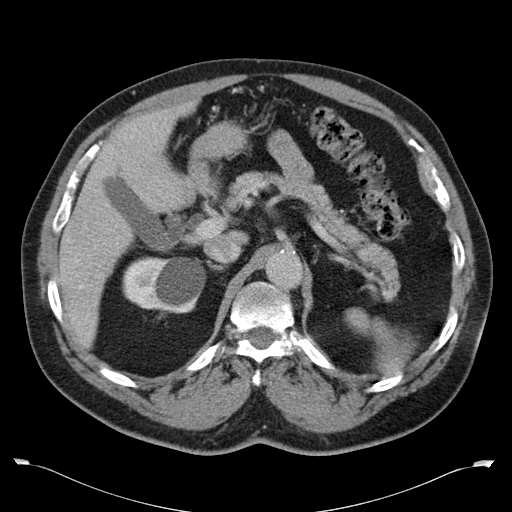
[im 68/91  lung]
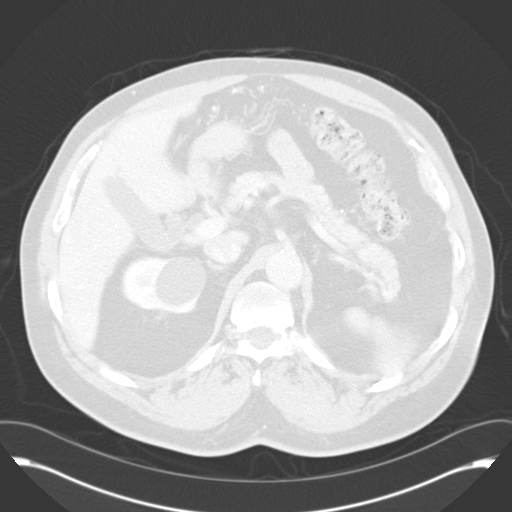
[im 79/91  soft-tissue]
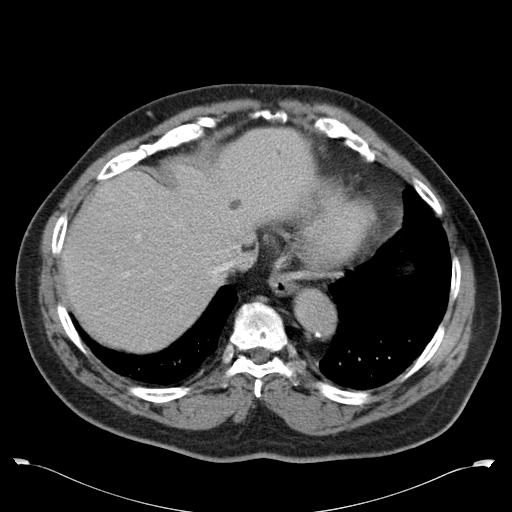
[im 79/91  lung]
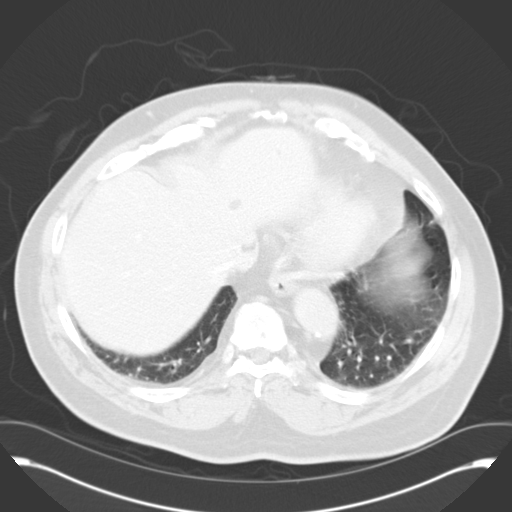

[Series 602: cor mpr · coronal · 0.91mm/px · 2 of 148 slices shown, 3 images]
[im 50/148  soft-tissue]
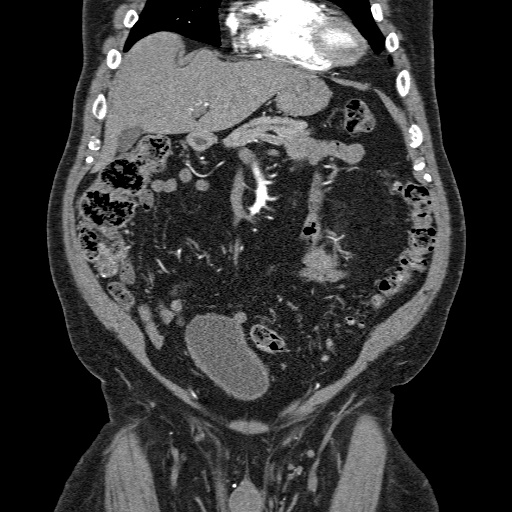
[im 50/148  bone]
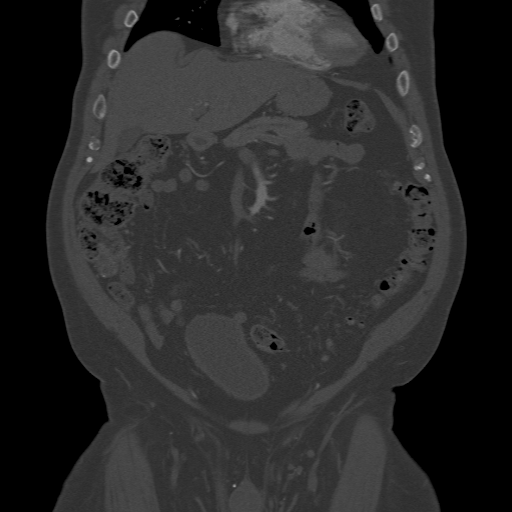
[im 99/148  soft-tissue]
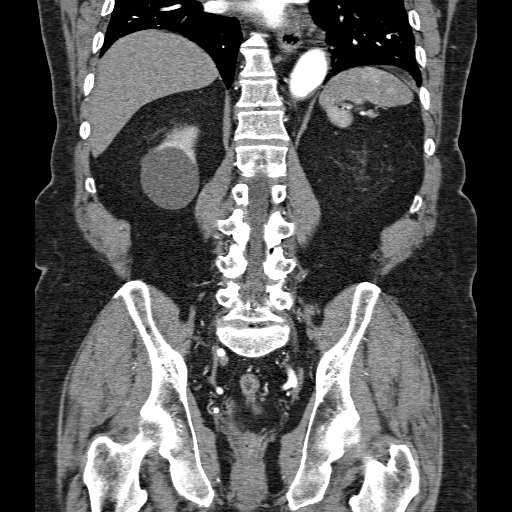

[11 of 46 positions shown; findings below may reference images not displayed]

FINDINGS: ARTERIAL FINDINGS:

Aorta: There is mild atherosclerotic plaque in the infrarenal
abdominal aorta without dilatation. Maximum diameter of the
infrarenal abdominal aorta measures up to 2.6 cm. There may be a
small intimal flap along the right side of the distal abdominal
aorta near the origin of the right common iliac artery.

Celiac axis: Celiac trunk is patent with very mild narrowing at the
origin. The two main branches of the celiac trunk are the left
gastric artery and splenic artery.

Superior mesenteric: The superior mesenteric artery is widely patent
with a replaced common hepatic artery which is a normal variant. SMA
branches are patent.

Left renal:          Single left renal artery without stenosis.

Right renal: There are two right renal arteries that originate near
the same level. Mild atherosclerotic disease involving these vessels
without significant narrowing.

Inferior mesenteric: Inferior mesenteric artery is widely patent.

Left iliac: Mild atherosclerotic disease in the left common iliac
artery with tortuosity. No significant stenosis. The left internal
and external iliac arteries are patent. Proximal left femoral
arteries are patent.

Right iliac: There appears to be a small intimal flap or irregular
plaque near the origin of the right common iliac artery without
significant stenosis. Right common iliac artery is patent. The right
internal and external iliac arteries have mild atherosclerotic
disease without significant stenosis. The proximal right femoral
arteries are patent.

Venous findings: Portal venous system is patent. Renal veins are
patent. IVC and iliac veins are not well opacified with contrast but
there is no gross abnormality in the iliac veins or IVC.

Review of the MIP images confirms the above findings.

NONVASCULAR FINDINGS:

Lung bases are clear. Again noted are multiple low-density
structures scattered throughout the liver parenchyma which likely
represent hepatic cysts. Normal appearance of the gallbladder,
spleen and pancreas. Normal appearance of the adrenal glands. Again
noted are multiple bilateral low-density renal cysts. Largest right
renal cyst measures up to 6.1 cm and largest left renal cyst
measures up to 7.1 cm. Probable 0.6 cm nonobstructive stone in the
right kidney interpolar region. Normal appearance of the stomach.
There is no significant free fluid or lymphadenopathy. Again noted
is a prominent prostate, measuring 5.8 cm in the transverse
dimension. No acute abnormality to the small or large bowel. There
is surgical mesh material along the anterior abdominal wall. Small
amount of fluid in the urinary bladder. No acute bone abnormality.
Facet arthropathy in the lower lumbar spine. Again noted is a 1.5 cm
low-density soft tissue structure in the left lower abdominal
subcutaneous tissues. This is likely an incidental finding.
IMPRESSION: No acute abnormality in the abdomen or pelvis.

Atherosclerotic plaque involving the abdominal aorta without
significant dilatation or stenosis. Small irregular plaque or small
intimal flap near the origin of the right common iliac artery is
probably not hemodynamically significant.

No mesenteric artery stenosis.

Variant mesenteric artery anatomy, the common hepatic artery
originates from the superior mesenteric artery.

Multiple bilateral renal cysts.

Nonobstructive right kidney stone.

Prostate enlargement.

## 2017-04-22 ENCOUNTER — Other Ambulatory Visit: Payer: Self-pay | Admitting: Endocrinology

## 2017-06-03 ENCOUNTER — Ambulatory Visit: Payer: Medicare Other | Admitting: Podiatry

## 2017-07-18 ENCOUNTER — Other Ambulatory Visit: Payer: Self-pay | Admitting: Endocrinology

## 2017-09-16 ENCOUNTER — Encounter: Payer: Self-pay | Admitting: Podiatry

## 2017-09-16 ENCOUNTER — Ambulatory Visit: Payer: Medicare Other | Admitting: Podiatry

## 2017-09-16 DIAGNOSIS — M79671 Pain in right foot: Secondary | ICD-10-CM

## 2017-09-16 DIAGNOSIS — B351 Tinea unguium: Secondary | ICD-10-CM

## 2017-09-16 DIAGNOSIS — M79672 Pain in left foot: Secondary | ICD-10-CM

## 2017-09-16 NOTE — Patient Instructions (Signed)
Seen for hypertrophic nails. All nails debrided. Return in 3 months or as needed.  

## 2017-09-16 NOTE — Progress Notes (Signed)
Subjective: 84 y.o. year old male patient presents complaining of painful nails. Patient requests toe nails trimmed.  Last time blood sugar was 114 a week ago. His last visit to this office was 6 months ago.  Dermatologic: Thick yellow deformed nails x 10. Painful in closed in shoes. Vascular: Pedal pulses are not palpable. Orthopedic: First MCJ dorsal spur on right with elevated first metatarsal and flattening MLA. Neurologic: All epicritic and tactile sensations grossly intact.  Assessment: Dystrophic mycotic nails x 10. Diabetic under control. Pain in both feet.  Treatment: All mycotic nails debrided.  Return in 3 months or as needed.

## 2017-10-02 ENCOUNTER — Other Ambulatory Visit: Payer: Self-pay | Admitting: Endocrinology

## 2017-12-25 ENCOUNTER — Other Ambulatory Visit: Payer: Self-pay | Admitting: Endocrinology

## 2018-01-03 ENCOUNTER — Other Ambulatory Visit: Payer: Self-pay | Admitting: Endocrinology

## 2018-01-03 NOTE — Telephone Encounter (Signed)
Please refill x 3 mos Further refill from new PCP

## 2018-04-13 ENCOUNTER — Other Ambulatory Visit: Payer: Self-pay | Admitting: Endocrinology

## 2019-11-11 ENCOUNTER — Encounter (HOSPITAL_BASED_OUTPATIENT_CLINIC_OR_DEPARTMENT_OTHER): Payer: Self-pay | Admitting: *Deleted

## 2019-11-11 ENCOUNTER — Emergency Department (HOSPITAL_BASED_OUTPATIENT_CLINIC_OR_DEPARTMENT_OTHER)
Admission: EM | Admit: 2019-11-11 | Discharge: 2019-11-11 | Disposition: A | Discharge status: Home / Self Care | Payer: Medicare Other | Attending: Emergency Medicine | Admitting: Emergency Medicine

## 2019-11-11 ENCOUNTER — Other Ambulatory Visit: Payer: Self-pay

## 2019-11-11 DIAGNOSIS — F0390 Unspecified dementia without behavioral disturbance: Secondary | ICD-10-CM | POA: Insufficient documentation

## 2019-11-11 DIAGNOSIS — Z87891 Personal history of nicotine dependence: Secondary | ICD-10-CM | POA: Insufficient documentation

## 2019-11-11 DIAGNOSIS — K625 Hemorrhage of anus and rectum: Secondary | ICD-10-CM | POA: Insufficient documentation

## 2019-11-11 DIAGNOSIS — Z7982 Long term (current) use of aspirin: Secondary | ICD-10-CM | POA: Diagnosis not present

## 2019-11-11 DIAGNOSIS — Z79899 Other long term (current) drug therapy: Secondary | ICD-10-CM | POA: Diagnosis not present

## 2019-11-11 DIAGNOSIS — I10 Essential (primary) hypertension: Secondary | ICD-10-CM | POA: Diagnosis not present

## 2019-11-11 DIAGNOSIS — Z8549 Personal history of malignant neoplasm of other male genital organs: Secondary | ICD-10-CM | POA: Diagnosis not present

## 2019-11-11 LAB — CBC WITH DIFFERENTIAL/PLATELET
Abs Immature Granulocytes: 0 10*3/uL (ref 0.00–0.07)
Basophils Absolute: 0.1 10*3/uL (ref 0.0–0.1)
Basophils Relative: 1 %
Eosinophils Absolute: 0.3 10*3/uL (ref 0.0–0.5)
Eosinophils Relative: 6 %
HCT: 42.8 % (ref 39.0–52.0)
Hemoglobin: 14.1 g/dL (ref 13.0–17.0)
Immature Granulocytes: 0 %
Lymphocytes Relative: 31 %
Lymphs Abs: 1.6 10*3/uL (ref 0.7–4.0)
MCH: 27.6 pg (ref 26.0–34.0)
MCHC: 32.9 g/dL (ref 30.0–36.0)
MCV: 83.8 fL (ref 80.0–100.0)
Monocytes Absolute: 0.7 10*3/uL (ref 0.1–1.0)
Monocytes Relative: 13 %
Neutro Abs: 2.6 10*3/uL (ref 1.7–7.7)
Neutrophils Relative %: 49 %
Platelets: 201 10*3/uL (ref 150–400)
RBC: 5.11 MIL/uL (ref 4.22–5.81)
RDW: 14.9 % (ref 11.5–15.5)
WBC: 5.3 10*3/uL (ref 4.0–10.5)
nRBC: 0 % (ref 0.0–0.2)

## 2019-11-11 LAB — COMPREHENSIVE METABOLIC PANEL
ALT: 22 U/L (ref 0–44)
AST: 21 U/L (ref 15–41)
Albumin: 3.7 g/dL (ref 3.5–5.0)
Alkaline Phosphatase: 46 U/L (ref 38–126)
Anion gap: 11 (ref 5–15)
BUN: 23 mg/dL (ref 8–23)
CO2: 20 mmol/L — ABNORMAL LOW (ref 22–32)
Calcium: 9.9 mg/dL (ref 8.9–10.3)
Chloride: 100 mmol/L (ref 98–111)
Creatinine, Ser: 1.3 mg/dL — ABNORMAL HIGH (ref 0.61–1.24)
GFR calc Af Amer: 58 mL/min — ABNORMAL LOW (ref >60)
GFR calc non Af Amer: 50 mL/min — ABNORMAL LOW (ref >60)
Glucose, Bld: 119 mg/dL — ABNORMAL HIGH (ref 70–99)
Potassium: 3.8 mmol/L (ref 3.5–5.1)
Sodium: 131 mmol/L — ABNORMAL LOW (ref 135–145)
Total Bilirubin: 0.4 mg/dL (ref 0.3–1.2)
Total Protein: 7.5 g/dL (ref 6.5–8.1)

## 2019-11-11 LAB — OCCULT BLOOD X 1 CARD TO LAB, STOOL: Fecal Occult Bld: NEGATIVE

## 2019-11-11 NOTE — ED Triage Notes (Signed)
C/o rectal bleeding bright red  One episode today

## 2019-11-11 NOTE — Discharge Instructions (Signed)
1.  At this time there is no blood present on your exam.  The stool card tested negative and the stool is yellowish in appearance.  Your blood counts are normal with a hemoglobin of 14. 2.  Schedule a follow-up appointment with your doctor within the next 2 to 4 days.  Get a recheck of your blood count to see if it is slowly decreasing.  Return to the emergency department if you are lightheaded, see blood, have pain or other concerning problems.

## 2019-11-11 NOTE — ED Provider Notes (Signed)
[facility] EMERGENCY DEPARTMENT Provider Note   CSN: [id number] Arrival date & time: 11/11/19  1603     History Chief Complaint  Patient presents with  . Rectal Bleeding    Miguel Blake is a 86 y.o. male.  HPI Patient reports he had a bowel movement earlier today and it was bloody in appearance.  It was bright red in appearance.  He denies he had any pain.  Patient reports this did happen a second time and again it was red in appearance.  Still no pain associated.  Patient denies he feels lightheaded or dizzy.  He has not been sick.  He has been eating as usual.  He does not take any blood thinners.  No daily aspirin.  No prior history of GI bleed.    Past Medical History:  Diagnosis Date  . Anxiety   . Bilateral renal cysts   . Depression   . Dyspnea on exertion    chronic  . ED (erectile dysfunction) of organic origin   . First degree heart block   . GERD (gastroesophageal reflux disease)   . History of pulmonary edema    NON-CARDIOGENIC  . Hx of adenomatous colonic polyps 09/05/2014  . Hyperlipidemia   . Hypertension   . Nocturia   . OA (osteoarthritis)   . Phimosis   . Prostate cancer ([place]) UROLOGIST-  DR Risa Grill   dx 2003--  T1c, Gleason 6, last PSA 4.1 in Oct 2016-- treatment active surivellance   . Renal insufficiency   . Sigmoid diverticulosis     Patient Active Problem List   Diagnosis Date Noted  . Gynecomastia 12/06/2016  . Aneurysm, thoracic aortic ([place]) 12/06/2016  . Uncircumcised male 07/12/2015  . Arthralgia 05/26/2015  . Dry mouth 05/26/2015  . Chest pain 12/14/2014  . Hx of adenomatous colonic polyps 09/05/2014  . Atherosclerosis of abdominal aorta ([place]) 08/01/2014  . Cataract 12/31/2013  . Dyspnea 12/07/2013  . Hyponatremia 12/01/2013  . Hyperglycemia 12/01/2013  . Screening for prostate cancer 12/01/2013  . Abdominal pain 10/01/2013  . Dizzy 12/25/2012  . Shortness of breath 08/28/2011  . Rhinitis due to pollen 07/17/2010    . Atrioventricular block, first degree 06/04/2010  . Routine general medical examination at a health care facility 06/04/2010  . Wheezing 06/04/2010  . Iron deficiency anemia due to chronic blood loss 04/23/2010  . KNEE PAIN, BILATERAL 04/23/2010  . MYALGIA 05/08/2009  . HYPERCHOLESTEROLEMIA 05/11/2008  . BPH (benign prostatic hyperplasia) 05/11/2008  . 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 SHOULDER PAIN, RIGHT 04/13/2008  . PAIN IN SOFT TISSUES OF LIMB 04/13/2008  . DEPRESSION 08/18/2007  . HEADACHE 07/17/2007  . Dementia ([place]) 09/15/2006  . Essential hypertension 09/15/2006  . OSTEOARTHRITIS 09/15/2006    Past Surgical History:  Procedure Laterality Date  . APPENDECTOMY  09-11-1999   via Exploratory laparotomy  . CARDIOVASCULAR STRESS TEST  02-17-2015   dr Stanford Breed   normal myocardial perfusion study w/ no ischemia/   normal LV function and wall motion , ef 62%  . CATARACT EXTRACTION W/ INTRAOCULAR LENS IMPLANT Right 2015  . CIRCUMCISION N/A 08/30/2015   Procedure: CIRCUMCISION ADULT;  Surgeon: Rana Snare, MD;  Location: [facility];  Service: Urology;  Laterality: N/A;  . COLONOSCOPY  last one 08-25-2014  . LAPAROSCOPIC INCISIONAL VENTRAL HERNIA REPAIR W/ MESH  12-24-2000  . TRANSTHORACIC ECHOCARDIOGRAM  02-17-2015   mild focal basal LVH,  grade 1 diastolic dysfunction, ef 93-26%/  mild AV sclerosis without  stenosis/  mild AR and TR       Family History  Problem Relation Age of Onset  . Prostate cancer Father   . Hypertension Mother   . Colon cancer Neg Hx   . Colon polyps Neg Hx   . Esophageal cancer Neg Hx   . Heart disease Neg Hx   . Diabetes Neg Hx   . Gallbladder disease Neg Hx   . Kidney disease Neg Hx   . Rectal cancer Neg Hx   . Stomach cancer Neg Hx     Social History   Tobacco Use  . Smoking status: Former Smoker    Packs/day: 1.00    Years: 25.00    Pack years: 25.00    Types: Cigarettes    Quit date: 02/18/1973    Years  since quitting: 46.7  . Smokeless tobacco: Never Used  Substance Use Topics  . Alcohol use: No    Comment: hx alcohol abuse --  quit 1970's  . Drug use: No    Home Medications Prior to Admission medications   Medication Sig Start Date End Date Taking? Authorizing Provider  amLODipine (NORVASC) 5 MG tablet Take 0.5 tablets (2.5 mg total) by mouth daily. 11/07/16   Renato Shin, MD  amLODipine (NORVASC) 5 MG tablet TAKE 1 TABLET(5 MG) BY MOUTH DAILY 12/10/16   Renato Shin, MD  aspirin 81 MG tablet Take 81 mg by mouth daily.    [provider]  citalopram (CELEXA) 20 MG tablet TAKE 1 TABLET BY MOUTH EVERY DAY 12/10/16   Renato Shin, MD  diclofenac sodium (VOLTAREN) 1 % GEL Apply 4 g topically 4 (four) times daily. 11/15/15   Renato Shin, MD  ezetimibe (ZETIA) 10 MG tablet TAKE 1 TABLET BY MOUTH EVERY DAY 12/10/16   Renato Shin, MD  finasteride (PROSCAR) 5 MG tablet TAKE 1 TABLET BY MOUTH EVERY DAY 01/05/18   Renato Shin, MD  furosemide (LASIX) 20 MG tablet Take 1 tablet (20 mg total) by mouth daily. 11/07/16   Renato Shin, MD  losartan-hydrochlorothiazide [facility]) 100-25 MG tablet TAKE 1 TABLET BY MOUTH EVERY DAY 12/10/16   Renato Shin, MD  methocarbamol (ROBAXIN) 500 MG tablet Take 500 mg by mouth 3 (three) times daily.    [provider]  omeprazole (PRILOSEC) 20 MG capsule TAKE 1 CAPSULE BY MOUTH EVERY DAY 07/18/17   Renato Shin, MD  tamsulosin (FLOMAX) 0.4 MG CAPS capsule Take 1 capsule (0.4 mg total) by mouth daily. 03/28/17   Renato Shin, MD    Allergies    Fluvastatin sodium, Penicillins, and Vasotec [enalapril]  Review of Systems   Review of Systems 10 systems reviewed and negative except as per HPI Physical Exam Updated Vital Signs BP (!) 150/95 (BP Location: Right Arm)   Pulse 81   Temp 98 F (36.7 C) (Oral)   Resp 16   Ht 5\' 10"  (1.778 m)   Wt 106.6 kg   SpO2 98%   BMI 33.72 kg/m   Physical Exam Constitutional:      Comments: Alert  and nontoxic.  Clinically well in appearance.  No respiratory distress.  Mental status clear.  HENT:     Head: Normocephalic and atraumatic.     Mouth/Throat:     Mouth: Mucous membranes are moist.     Pharynx: Oropharynx is clear.  Eyes:     Extraocular Movements: Extraocular movements intact.  Cardiovascular:     Rate and Rhythm: Normal rate and regular rhythm.  Pulmonary:  Effort: Pulmonary effort is normal.     Breath sounds: Normal breath sounds.  Abdominal:     General: There is no distension.     Palpations: Abdomen is soft.     Tenderness: There is no abdominal tenderness. There is no guarding.  Genitourinary:    Comments: Normal perianal area without hemorrhoids or blood.  Digital exam no formed stool in the vault.  Possible internal hemorrhoid ridges.  No firm masses.  Stool is yellow in appearance.  No visible blood.  No melena. Musculoskeletal:        General: Normal range of motion.  Skin:    General: Skin is warm and dry.  Neurological:     General: No focal deficit present.     Mental Status: He is oriented to person, place, and time.  Psychiatric:        Mood and Affect: Mood normal.     ED Results / Procedures / Treatments   Labs (all labs ordered are listed, but only abnormal results are displayed) Labs Reviewed  COMPREHENSIVE METABOLIC PANEL - Abnormal; Notable for the following components:      Result Value   Sodium 131 (*)    CO2 20 (*)    Glucose, Bld 119 (*)    Creatinine, Ser 1.30 (*)    GFR calc non Af Amer 50 (*)    GFR calc Af Amer 58 (*)    All other components within normal limits  CBC WITH DIFFERENTIAL/PLATELET  OCCULT BLOOD X 1 CARD TO LAB, STOOL    EKG None  Radiology No results found.  Procedures Procedures (including critical care time)  Medications Ordered in ED Medications - No data to display  ED Course  I have reviewed the triage vital signs and the nursing notes.  Pertinent labs & imaging results that were  available during my care of the patient were reviewed by me and considered in my medical decision making (see chart for details).    MDM Rules/Calculators/A&P                         Patient is well in appearance.  He reports red rectal blood 2 times today.  No associated symptoms and no pain.  Patient is not on anticoagulants.  Patient's hemoglobin is 14, vital signs are normal, rectal exam does not show any evidence of blood or melena.  At this time, patient is stable for discharge.  Plan will be to follow-up with PCP in several days for recheck of blood count.  Possibly intermittently bleeding internal hemorrhoids.  Continue to observe stool for any appearance of blood.  Return precautions reviewed. Final Clinical Impression(s) / ED Diagnoses Final diagnoses:  Rectal bleeding    Rx / DC Orders ED Discharge Orders    None       Charlesetta Shanks, MD 11/11/19 2145

## 2020-04-18 ENCOUNTER — Other Ambulatory Visit: Payer: Self-pay | Admitting: Physician Assistant

## 2020-04-18 DIAGNOSIS — M545 Low back pain, unspecified: Secondary | ICD-10-CM

## 2020-04-18 DIAGNOSIS — M549 Dorsalgia, unspecified: Secondary | ICD-10-CM

## 2020-05-04 ENCOUNTER — Ambulatory Visit
Admission: RE | Admit: 2020-05-04 | Discharge: 2020-05-04 | Disposition: A | Discharge status: Home / Self Care | Payer: Medicare Other | Source: Ambulatory Visit | Attending: Physician Assistant | Admitting: Physician Assistant

## 2020-05-04 DIAGNOSIS — M545 Low back pain, unspecified: Secondary | ICD-10-CM
